# Patient Record
Sex: Female | Born: 1985 | Race: Asian | Hispanic: Yes | Marital: Married | Smoking: Never smoker
Health system: Southern US, Community
[De-identification: ages and names within clinical notes are randomized; demographics above are authoritative.]

## PROBLEM LIST (undated history)

## (undated) DIAGNOSIS — O24419 Gestational diabetes mellitus in pregnancy, unspecified control: Secondary | ICD-10-CM

## (undated) DIAGNOSIS — Z789 Other specified health status: Secondary | ICD-10-CM

---
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
## 2016-11-29 DIAGNOSIS — O321XX0 Maternal care for breech presentation, not applicable or unspecified: Secondary | ICD-10-CM

## 2018-07-16 ENCOUNTER — Other Ambulatory Visit: Payer: Self-pay

## 2018-07-16 ENCOUNTER — Emergency Department (HOSPITAL_COMMUNITY): Payer: Self-pay

## 2018-07-16 ENCOUNTER — Emergency Department (HOSPITAL_COMMUNITY)
Admission: EM | Admit: 2018-07-16 | Discharge: 2018-07-16 | Disposition: A | Discharge status: Home / Self Care | Payer: Self-pay | Attending: Emergency Medicine | Admitting: Emergency Medicine

## 2018-07-16 DIAGNOSIS — K625 Hemorrhage of anus and rectum: Secondary | ICD-10-CM | POA: Insufficient documentation

## 2018-07-16 DIAGNOSIS — Z79899 Other long term (current) drug therapy: Secondary | ICD-10-CM | POA: Insufficient documentation

## 2018-07-16 DIAGNOSIS — R1030 Lower abdominal pain, unspecified: Secondary | ICD-10-CM | POA: Insufficient documentation

## 2018-07-16 LAB — COMPREHENSIVE METABOLIC PANEL
ALK PHOS: 54 U/L (ref 38–126)
ALT: 13 U/L (ref 0–44)
AST: 17 U/L (ref 15–41)
Albumin: 3.8 g/dL (ref 3.5–5.0)
Anion gap: 8 (ref 5–15)
BILIRUBIN TOTAL: 0.6 mg/dL (ref 0.3–1.2)
BUN: 8 mg/dL (ref 6–20)
CO2: 25 mmol/L (ref 22–32)
Calcium: 9.1 mg/dL (ref 8.9–10.3)
Chloride: 105 mmol/L (ref 98–111)
Creatinine, Ser: 0.53 mg/dL (ref 0.44–1.00)
GFR calc Af Amer: >60 mL/min (ref >60)
GFR calc non Af Amer: >60 mL/min (ref >60)
Glucose, Bld: 106 mg/dL — ABNORMAL HIGH (ref 70–99)
Potassium: 4 mmol/L (ref 3.5–5.1)
Sodium: 138 mmol/L (ref 135–145)
TOTAL PROTEIN: 6.9 g/dL (ref 6.5–8.1)

## 2018-07-16 LAB — CBC WITH DIFFERENTIAL/PLATELET
Abs Immature Granulocytes: 0.03 10*3/uL (ref 0.00–0.07)
Basophils Absolute: 0 10*3/uL (ref 0.0–0.1)
Basophils Relative: 0 %
Eosinophils Absolute: 0.1 10*3/uL (ref 0.0–0.5)
Eosinophils Relative: 1 %
HCT: 40.5 % (ref 36.0–46.0)
HEMOGLOBIN: 12.7 g/dL (ref 12.0–15.0)
Immature Granulocytes: 0 %
Lymphocytes Relative: 23 %
Lymphs Abs: 1.5 10*3/uL (ref 0.7–4.0)
MCH: 27.7 pg (ref 26.0–34.0)
MCHC: 31.4 g/dL (ref 30.0–36.0)
MCV: 88.2 fL (ref 80.0–100.0)
MONO ABS: 0.4 10*3/uL (ref 0.1–1.0)
MONOS PCT: 6 %
Neutro Abs: 4.6 10*3/uL (ref 1.7–7.7)
Neutrophils Relative %: 70 %
Platelets: 241 10*3/uL (ref 150–400)
RBC: 4.59 MIL/uL (ref 3.87–5.11)
RDW: 12.5 % (ref 11.5–15.5)
WBC: 6.7 10*3/uL (ref 4.0–10.5)
nRBC: 0 % (ref 0.0–0.2)

## 2018-07-16 LAB — I-STAT BETA HCG BLOOD, ED (MC, WL, AP ONLY): I-stat hCG, quantitative: <5 m[IU]/mL (ref <5)

## 2018-07-16 MED ORDER — IOHEXOL 300 MG/ML  SOLN
100.0000 mL | Freq: Once | INTRAMUSCULAR | Status: AC | PRN
  Administered 2018-07-16: 100 mL via INTRAVENOUS

## 2018-07-16 MED ORDER — DOCUSATE SODIUM 100 MG PO CAPS: 100.0000 mg | ORAL_CAPSULE | Freq: Two times a day (BID) | ORAL | 2 refills | Status: AC

## 2018-07-16 MED ORDER — HYDROCORTISONE ACETATE 25 MG RE SUPP: 25.0000 mg | Freq: Two times a day (BID) | RECTAL | 1 refills | Status: AC

## 2018-07-16 NOTE — ED Provider Notes (Signed)
[facility] EMERGENCY DEPARTMENT Provider Note   CSN: [id number] Arrival date & time: 07/16/18  1660     History   Chief Complaint Chief Complaint  Patient presents with  . Rectal Bleeding    HPI Stephanie Wolfe is a 33 y.o. female.  The history is provided by the patient. No language interpreter was used.  Rectal Bleeding  Quality:  Bright red Amount:  Moderate Duration:  1 day Timing:  Constant Chronicity:  New Context: rectal injury   Context: not hemorrhoids   Similar prior episodes: no   Relieved by:  Nothing Worsened by:  Nothing Associated symptoms: abdominal pain   Pt reports bright red blood from rectum.    No past medical history on file.  There are no active problems to display for this patient.      OB History   No obstetric history on file.      Home Medications    Prior to Admission medications   Medication Sig Start Date End Date Taking? Authorizing Provider  famotidine (PEPCID) 20 MG tablet Take 20 mg by mouth 2 (two) times daily.   Yes [provider]    Family History No family history on file.  Social History Social History   Tobacco Use  . Smoking status: Not on file  Substance Use Topics  . Alcohol use: Not on file  . Drug use: Not on file     Allergies   Patient has no allergy information on record.   Review of Systems Review of Systems  Gastrointestinal: Positive for abdominal pain and hematochezia.  All other systems reviewed and are negative.    Physical Exam Updated Vital Signs BP 112/73   Pulse 74   Temp 98.2 F (36.8 C) (Oral)   Resp 16   Ht 5' 4.96" (1.65 m)   Wt 74.8 kg   SpO2 100%   BMI 27.49 kg/m   Physical Exam Vitals signs and nursing note reviewed.  Constitutional:      Appearance: She is well-developed.  HENT:     Head: Normocephalic.     Right Ear: Tympanic membrane normal.     Left Ear: Tympanic membrane normal.     Nose: Nose normal.     Mouth/Throat:   Mouth: Mucous membranes are moist.  Neck:     Musculoskeletal: Normal range of motion.  Cardiovascular:     Rate and Rhythm: Normal rate.     Pulses: Normal pulses.  Pulmonary:     Effort: Pulmonary effort is normal.  Abdominal:     General: There is no distension.     Tenderness: There is abdominal tenderness.     Comments: Tender lower abdomen   Genitourinary:    Rectum: Normal.  Musculoskeletal: Normal range of motion.  Skin:    General: Skin is warm.  Neurological:     Mental Status: She is alert and oriented to person, place, and time.  Psychiatric:        Mood and Affect: Mood normal.      ED Treatments / Results  Labs (all labs ordered are listed, but only abnormal results are displayed) Labs Reviewed  COMPREHENSIVE METABOLIC PANEL - Abnormal; Notable for the following components:      Result Value   Glucose, Bld 106 (*)    All other components within normal limits  CBC WITH DIFFERENTIAL/PLATELET  URINALYSIS, ROUTINE W REFLEX MICROSCOPIC  I-STAT BETA HCG BLOOD, ED ([facility], [facility], [facility] ONLY)    EKG None  Radiology Ct Abdomen Pelvis W Contrast  Result Date: 07/16/2018 CLINICAL DATA:  Abdominal pain and hematochezia for 2 days. EXAM: CT ABDOMEN AND PELVIS WITH CONTRAST TECHNIQUE: Multidetector CT imaging of the abdomen and pelvis was performed using the standard protocol following bolus administration of intravenous contrast. CONTRAST:  OMNIPAQUE IOHEXOL 300 MG/ML  SOLN COMPARISON:  None. FINDINGS: Lower Chest: No acute findings. Hepatobiliary: A few tiny sub-cm low-attenuation lesions are seen which are too small to characterize but most likely represent tiny cysts. No definite hepatic masses identified. Gallbladder is unremarkable. No evidence of biliary ductal dilatation. Pancreas:  No mass or inflammatory changes. Spleen: Within normal limits in size and appearance. Adrenals/Urinary Tract: No masses identified. No evidence of hydronephrosis. Unremarkable unopacified  urinary bladder. Stomach/Bowel: No evidence of obstruction, inflammatory process or abnormal fluid collections. Normal appendix visualized. Vascular/Lymphatic: No pathologically enlarged lymph nodes. No abdominal aortic aneurysm. Reproductive:  No mass or other significant abnormality. Other:  None. Musculoskeletal:  No suspicious bone lesions identified. IMPRESSION: No acute findings or other significant abnormality. Electronically Signed   By: Myles Rosenthal M.D.   On: 07/16/2018 12:13    Procedures Procedures (including critical care time)  Medications Ordered in ED Medications  iohexol (OMNIPAQUE) 300 MG/ML solution 100 mL (100 mLs Intravenous Contrast Given 07/16/18 1132)     Initial Impression / Assessment and Plan / ED Course  I have reviewed the triage vital signs and the nursing notes.  Pertinent labs & imaging results that were available during my care of the patient were reviewed by me and considered in my medical decision making (see chart for details).     MDM  No hemmorhoids noted.  Pt advised to follow up with her Gi doctor.  I suspect internal hemorrhoid.  Ct shows no diverticulitis   Final Clinical Impressions(s) / ED Diagnoses   Final diagnoses:  Rectal bleeding    ED Discharge Orders         Ordered    docusate sodium (COLACE) 100 MG capsule  2 times daily     07/16/18 1331    hydrocortisone (ANUSOL-HC) 25 MG suppository  Every 12 hours     07/16/18 1331           Osie Cheeks 07/16/18 1343    Gerhard Munch, MD 07/16/18 [phone]

## 2018-07-16 NOTE — ED Notes (Signed)
Pt given discharge instructions, prescription, and follow up information. Pt verbalized understanding. Pt given the opportunity to answer questions. Pt discharged from the ED.

## 2018-07-16 NOTE — ED Triage Notes (Addendum)
Pt reports that for the last two days she has had red blood in her stools and abdominal pain. Pt denies any nausea or vomiting.

## 2018-07-16 NOTE — Discharge Instructions (Signed)
See your Physician for recheck.  Return if any problems.  °

## 2019-06-26 ENCOUNTER — Other Ambulatory Visit: Payer: Self-pay

## 2019-06-26 ENCOUNTER — Ambulatory Visit (INDEPENDENT_AMBULATORY_CARE_PROVIDER_SITE_OTHER): Payer: Medicaid Other | Admitting: Medical

## 2019-06-26 ENCOUNTER — Other Ambulatory Visit (HOSPITAL_COMMUNITY)
Admission: RE | Admit: 2019-06-26 | Discharge: 2019-06-26 | Disposition: A | Discharge status: Home / Self Care | Payer: Medicaid Other | Source: Ambulatory Visit | Attending: Medical | Admitting: Medical

## 2019-06-26 ENCOUNTER — Encounter: Payer: Self-pay | Admitting: Medical

## 2019-06-26 VITALS — BP 114/77 | HR 91 | Wt 172.9 lb

## 2019-06-26 DIAGNOSIS — Z3480 Encounter for supervision of other normal pregnancy, unspecified trimester: Secondary | ICD-10-CM | POA: Diagnosis not present

## 2019-06-26 DIAGNOSIS — Z8759 Personal history of other complications of pregnancy, childbirth and the puerperium: Secondary | ICD-10-CM | POA: Insufficient documentation

## 2019-06-26 DIAGNOSIS — Z3A11 11 weeks gestation of pregnancy: Secondary | ICD-10-CM

## 2019-06-26 DIAGNOSIS — R8271 Bacteriuria: Secondary | ICD-10-CM

## 2019-06-26 DIAGNOSIS — O34219 Maternal care for unspecified type scar from previous cesarean delivery: Secondary | ICD-10-CM | POA: Insufficient documentation

## 2019-06-26 NOTE — Progress Notes (Signed)
   PRENATAL VISIT NOTE  Subjective:  Stephanie Wolfe is a 34 y.o. G5P4004 at [redacted]w[redacted]d being seen today for her first prenatal visit for this pregnancy.  She is currently monitored for the following issues for this high-risk pregnancy and has Supervision of other normal pregnancy, antepartum; Previous cesarean section complicating pregnancy, antepartum condition or complication; and History of amniotic fluid embolism on their problem list.  Patient reports no complaints.  Contractions: Not present. Vag. Bleeding: None.  Movement: Absent. Denies leaking of fluid.   She is planning to bottle feed. Desires condoms for contraception.   The following portions of the patient's history were reviewed and updated as appropriate: allergies, current medications, past family history, past medical history, past social history, past surgical history and problem list.   Objective:   Vitals:   06/26/19 1053  BP: 114/77  Pulse: 91  Weight: 172 lb 14.4 oz (78.4 kg)    Fetal Status: Fetal Heart Rate (bpm): [place]   Movement: Absent     General:  Alert, oriented and cooperative. Patient is in no acute distress.  Skin: Skin is warm and dry. No rash noted.   Cardiovascular: Normal heart rate and rhythm noted  Respiratory: Normal respiratory effort, no problems with respiration noted. Clear to auscultation.   Abdomen: Soft, gravid, appropriate for gestational age. Normal bowel sounds. Non-tender. Pain/Pressure: Absent     Pelvic: Cervical exam performed Dilation: Closed Effacement (%): Thick   Normal cervical contour, no lesions, no bleeding following pap, normal discharge Breast: declined   Extremities: Normal range of motion.  Edema: None  Mental Status: Normal mood and affect. Normal behavior. Normal judgment and thought content.   Assessment and Plan:  Pregnancy: G5P4004 at [redacted]w[redacted]d 1. Supervision of other normal pregnancy, antepartum - Cytology - PAP( [place]) - Obstetric Panel (and HIV) - Culture, OB  Urine - Panorama  2. Previous cesarean section complicating pregnancy, antepartum condition or complication - 2 previous C/S (see problem list for details)  - Planning repeat   3. History of amniotic fluid embolism - Patient to provide hospital information for ROI to confirm this diagnosis (see problem list for more details)   Preterm labor symptoms and general obstetric precautions including but not limited to vaginal bleeding, contractions, leaking of fluid and fetal movement were reviewed in detail with the patient. Please refer to After Visit Summary for other counseling recommendations.   No follow-ups on file.  Future Appointments  Date Time Provider Department Center  07/28/2019  1:00 PM Nugent, Odie Sera, NP [facility] None    Vonzella Nipple, PA-C

## 2019-06-26 NOTE — Progress Notes (Signed)
Pt is here for initial OB visit. LMP 04/06/19.

## 2019-06-26 NOTE — Patient Instructions (Signed)

## 2019-06-27 LAB — OBSTETRIC PANEL, INCLUDING HIV
Antibody Screen: NEGATIVE
Basophils Absolute: 0 10*3/uL (ref 0.0–0.2)
Basos: 0 %
EOS (ABSOLUTE): 0.1 10*3/uL (ref 0.0–0.4)
Eos: 1 %
HIV Screen 4th Generation wRfx: NONREACTIVE
Hematocrit: 38.1 % (ref 34.0–46.6)
Hemoglobin: 12.9 g/dL (ref 11.1–15.9)
Hepatitis B Surface Ag: NEGATIVE
Immature Grans (Abs): 0 10*3/uL (ref 0.0–0.1)
Immature Granulocytes: 0 %
Lymphocytes Absolute: 1.7 10*3/uL (ref 0.7–3.1)
Lymphs: 16 %
MCH: 29.1 pg (ref 26.6–33.0)
MCHC: 33.9 g/dL (ref 31.5–35.7)
MCV: 86 fL (ref 79–97)
Monocytes Absolute: 0.6 10*3/uL (ref 0.1–0.9)
Monocytes: 6 %
Neutrophils Absolute: 8.1 10*3/uL — ABNORMAL HIGH (ref 1.4–7.0)
Neutrophils: 77 %
Platelets: 248 10*3/uL (ref 150–450)
RBC: 4.44 x10E6/uL (ref 3.77–5.28)
RDW: 13 % (ref 11.7–15.4)
RPR Ser Ql: NONREACTIVE
Rh Factor: POSITIVE
Rubella Antibodies, IGG: 2.21 index (ref >0.99)
WBC: 10.5 10*3/uL (ref 3.4–10.8)

## 2019-06-29 LAB — URINE CULTURE, OB REFLEX

## 2019-06-29 LAB — CULTURE, OB URINE

## 2019-06-30 ENCOUNTER — Encounter: Payer: Self-pay | Admitting: Medical

## 2019-06-30 DIAGNOSIS — R8271 Bacteriuria: Secondary | ICD-10-CM | POA: Insufficient documentation

## 2019-06-30 MED ORDER — CEPHALEXIN 500 MG PO CAPS: 500.0000 mg | ORAL_CAPSULE | Freq: Four times a day (QID) | ORAL | 0 refills | Status: DC

## 2019-06-30 NOTE — Addendum Note (Signed)
Addended by: Marny Lowenstein on: 06/30/2019 09:01 AM   Modules accepted: Orders

## 2019-07-01 LAB — CYTOLOGY - PAP
Chlamydia: NEGATIVE
Comment: NEGATIVE
Comment: NEGATIVE
Comment: NORMAL
Diagnosis: NEGATIVE
High risk HPV: NEGATIVE
Neisseria Gonorrhea: NEGATIVE

## 2019-07-06 ENCOUNTER — Encounter: Payer: Self-pay | Admitting: Medical

## 2019-07-07 ENCOUNTER — Encounter: Payer: Self-pay | Admitting: Medical

## 2019-07-17 DIAGNOSIS — O9A212 Injury, poisoning and certain other consequences of external causes complicating pregnancy, second trimester: Secondary | ICD-10-CM | POA: Diagnosis present

## 2019-07-18 ENCOUNTER — Other Ambulatory Visit: Payer: Self-pay

## 2019-07-18 ENCOUNTER — Inpatient Hospital Stay (HOSPITAL_COMMUNITY)
Admission: AD | Admit: 2019-07-18 | Discharge: 2019-07-18 | Disposition: A | Discharge status: Home / Self Care | Payer: Medicaid Other | Attending: Family Medicine | Admitting: Family Medicine

## 2019-07-18 ENCOUNTER — Encounter (HOSPITAL_COMMUNITY): Payer: Self-pay | Admitting: Family Medicine

## 2019-07-18 DIAGNOSIS — S3991XA Unspecified injury of abdomen, initial encounter: Secondary | ICD-10-CM | POA: Diagnosis not present

## 2019-07-18 DIAGNOSIS — R109 Unspecified abdominal pain: Secondary | ICD-10-CM | POA: Insufficient documentation

## 2019-07-18 DIAGNOSIS — W501XXA Accidental kick by another person, initial encounter: Secondary | ICD-10-CM | POA: Diagnosis not present

## 2019-07-18 DIAGNOSIS — Z3A14 14 weeks gestation of pregnancy: Secondary | ICD-10-CM | POA: Diagnosis not present

## 2019-07-18 DIAGNOSIS — O9A212 Injury, poisoning and certain other consequences of external causes complicating pregnancy, second trimester: Secondary | ICD-10-CM

## 2019-07-18 DIAGNOSIS — O26892 Other specified pregnancy related conditions, second trimester: Secondary | ICD-10-CM | POA: Diagnosis not present

## 2019-07-18 DIAGNOSIS — Z3480 Encounter for supervision of other normal pregnancy, unspecified trimester: Secondary | ICD-10-CM

## 2019-07-18 LAB — URINALYSIS, ROUTINE W REFLEX MICROSCOPIC
Bilirubin Urine: NEGATIVE
Glucose, UA: 150 mg/dL — AB
Hgb urine dipstick: NEGATIVE
Ketones, ur: NEGATIVE mg/dL
Leukocytes,Ua: NEGATIVE
Nitrite: NEGATIVE
Protein, ur: 30 mg/dL — AB
Specific Gravity, Urine: 1.021 (ref 1.005–1.030)
pH: 7 (ref 5.0–8.0)

## 2019-07-18 MED ORDER — PREPLUS 27-1 MG PO TABS: 1.0000 | ORAL_TABLET | Freq: Once | ORAL | 3 refills | Status: AC

## 2019-07-18 NOTE — Discharge Instructions (Signed)
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
## 2019-07-18 NOTE — MAU Note (Signed)
Provider at bedside with U/S. FHB visible and +fetal movement observed. Pt reassured with exam.

## 2019-07-20 ENCOUNTER — Inpatient Hospital Stay (HOSPITAL_BASED_OUTPATIENT_CLINIC_OR_DEPARTMENT_OTHER): Payer: Medicaid Other

## 2019-07-20 ENCOUNTER — Inpatient Hospital Stay (HOSPITAL_COMMUNITY)
Admission: AD | Admit: 2019-07-20 | Discharge: 2019-07-20 | Disposition: A | Discharge status: Home / Self Care | Payer: Medicaid Other | Attending: Obstetrics & Gynecology | Admitting: Obstetrics & Gynecology

## 2019-07-20 ENCOUNTER — Other Ambulatory Visit: Payer: Self-pay

## 2019-07-20 ENCOUNTER — Encounter (HOSPITAL_COMMUNITY): Payer: Self-pay | Admitting: Obstetrics & Gynecology

## 2019-07-20 DIAGNOSIS — O209 Hemorrhage in early pregnancy, unspecified: Secondary | ICD-10-CM | POA: Diagnosis present

## 2019-07-20 DIAGNOSIS — O208 Other hemorrhage in early pregnancy: Secondary | ICD-10-CM | POA: Diagnosis not present

## 2019-07-20 DIAGNOSIS — O34219 Maternal care for unspecified type scar from previous cesarean delivery: Secondary | ICD-10-CM | POA: Diagnosis not present

## 2019-07-20 DIAGNOSIS — Z3A15 15 weeks gestation of pregnancy: Secondary | ICD-10-CM | POA: Diagnosis not present

## 2019-07-20 DIAGNOSIS — O9A212 Injury, poisoning and certain other consequences of external causes complicating pregnancy, second trimester: Secondary | ICD-10-CM | POA: Diagnosis present

## 2019-07-20 DIAGNOSIS — O4692 Antepartum hemorrhage, unspecified, second trimester: Secondary | ICD-10-CM

## 2019-07-20 DIAGNOSIS — O468X2 Other antepartum hemorrhage, second trimester: Secondary | ICD-10-CM | POA: Diagnosis present

## 2019-07-20 DIAGNOSIS — O418X20 Other specified disorders of amniotic fluid and membranes, second trimester, not applicable or unspecified: Secondary | ICD-10-CM | POA: Diagnosis present

## 2019-07-20 LAB — URINALYSIS, ROUTINE W REFLEX MICROSCOPIC
Bilirubin Urine: NEGATIVE
Glucose, UA: >=500 mg/dL — AB
Ketones, ur: NEGATIVE mg/dL
Leukocytes,Ua: NEGATIVE
Nitrite: NEGATIVE
Protein, ur: NEGATIVE mg/dL
Specific Gravity, Urine: 1.021 (ref 1.005–1.030)
pH: 7 (ref 5.0–8.0)

## 2019-07-20 LAB — WET PREP, GENITAL
Clue Cells Wet Prep HPF POC: NONE SEEN
Sperm: NONE SEEN
Trich, Wet Prep: NONE SEEN
Yeast Wet Prep HPF POC: NONE SEEN

## 2019-07-20 LAB — CBC
HCT: 35.7 % — ABNORMAL LOW (ref 36.0–46.0)
Hemoglobin: 12 g/dL (ref 12.0–15.0)
MCH: 29 pg (ref 26.0–34.0)
MCHC: 33.6 g/dL (ref 30.0–36.0)
MCV: 86.2 fL (ref 80.0–100.0)
Platelets: 222 10*3/uL (ref 150–400)
RBC: 4.14 MIL/uL (ref 3.87–5.11)
RDW: 13.2 % (ref 11.5–15.5)
WBC: 10.5 10*3/uL (ref 4.0–10.5)
nRBC: 0 % (ref 0.0–0.2)

## 2019-07-20 NOTE — Discharge Instructions (Signed)
Return to MAU: °· If you have heavier bleeding that soaks through more that 2 pads per hour for an hour or more °· If you bleed so much that you feel like you might pass out or you do pass out °· If you have significant abdominal pain that is not improved with Tylenol  °· If you develop a fever > 100.5 °  °

## 2019-07-20 NOTE — MAU Note (Addendum)
Pt reports to Stephanie Wolfe c/o vaginal bleeding that she noticed when she wiped in the bathroom pt reports it was red with some white "secretions." pt denies any pain. Pt denies any intercourse. Pt reports occasionally when she lays down she feels uncomfortable but denies any of those symptoms now.
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
## 2019-07-21 LAB — GC/CHLAMYDIA PROBE AMP (~~LOC~~) NOT AT ARMC
Chlamydia: NEGATIVE
Comment: NEGATIVE
Comment: NORMAL
Neisseria Gonorrhea: NEGATIVE

## 2019-07-22 LAB — CULTURE, OB URINE: Culture: 30000 — AB

## 2019-07-23 ENCOUNTER — Telehealth: Payer: Self-pay | Admitting: Certified Nurse Midwife

## 2019-07-23 DIAGNOSIS — B951 Streptococcus, group B, as the cause of diseases classified elsewhere: Secondary | ICD-10-CM

## 2019-07-23 MED ORDER — CEFADROXIL 500 MG PO CAPS: 500.0000 mg | ORAL_CAPSULE | Freq: Two times a day (BID) | ORAL | 0 refills | Status: DC

## 2019-07-23 NOTE — Telephone Encounter (Signed)
Called pt using [employer] interpreter [phone]. Pt confirms she finished abx about 2 weeks ago prescribed for asymptomatic GBS UTI. Denies urinary sx. Will treat again for GBS UTI present on UC 07/20/19.

## 2019-07-28 ENCOUNTER — Other Ambulatory Visit: Payer: Self-pay

## 2019-07-28 ENCOUNTER — Telehealth (INDEPENDENT_AMBULATORY_CARE_PROVIDER_SITE_OTHER): Payer: Medicaid Other | Admitting: Women's Health

## 2019-07-28 DIAGNOSIS — O418X20 Other specified disorders of amniotic fluid and membranes, second trimester, not applicable or unspecified: Secondary | ICD-10-CM | POA: Diagnosis not present

## 2019-07-28 DIAGNOSIS — O34219 Maternal care for unspecified type scar from previous cesarean delivery: Secondary | ICD-10-CM

## 2019-07-28 DIAGNOSIS — Z3480 Encounter for supervision of other normal pregnancy, unspecified trimester: Secondary | ICD-10-CM

## 2019-07-28 DIAGNOSIS — O468X2 Other antepartum hemorrhage, second trimester: Secondary | ICD-10-CM

## 2019-07-28 DIAGNOSIS — Z3A16 16 weeks gestation of pregnancy: Secondary | ICD-10-CM

## 2019-07-28 DIAGNOSIS — R8271 Bacteriuria: Secondary | ICD-10-CM

## 2019-07-28 DIAGNOSIS — O9A212 Injury, poisoning and certain other consequences of external causes complicating pregnancy, second trimester: Secondary | ICD-10-CM

## 2019-07-28 MED ORDER — BLOOD PRESSURE KIT DEVI: 1.0000 | 0 refills | Status: DC | PRN

## 2019-07-28 NOTE — Progress Notes (Signed)
Virtual Visit via Telephone Note  I connected with Stephanie Wolfe on 07/28/19 at  1:00 PM EST by telephone and verified that I am speaking with the correct person using two identifiers. Used interpreter  [phone]  Pt does not have a BP cuff. Pt c/o VB last week. No VB 2 days after hospital visit

## 2019-07-28 NOTE — Patient Instructions (Addendum)
Second Trimester of Pregnancy The second trimester is from week 14 through week 27 (months 4 through 6). The second trimester is often a time when you feel your best. Your body has adjusted to being pregnant, and you begin to feel better physically. Usually, morning sickness has lessened or quit completely, you may have more energy, and you may have an increase in appetite. The second trimester is also a time when the fetus is growing rapidly. At the end of the sixth month, the fetus is about 9 inches long and weighs about 1 pounds. You will likely begin to feel the baby move (quickening) between 16 and 20 weeks of pregnancy. Body changes during your second trimester Your body continues to go through many changes during your second trimester. The changes vary from woman to woman.  Your weight will continue to increase. You will notice your lower abdomen bulging out.  You may begin to get stretch marks on your hips, abdomen, and breasts.  You may develop headaches that can be relieved by medicines. The medicines should be approved by your health care provider.  You may urinate more often because the fetus is pressing on your bladder.  You may develop or continue to have heartburn as a result of your pregnancy.  You may develop constipation because certain hormones are causing the muscles that push waste through your intestines to slow down.  You may develop hemorrhoids or swollen, bulging veins (varicose veins).  You may have back pain. This is caused by: ? Weight gain. ? Pregnancy hormones that are relaxing the joints in your pelvis. ? A shift in weight and the muscles that support your balance.  Your breasts will continue to grow and they will continue to become tender.  Your gums may bleed and may be sensitive to brushing and flossing.  Dark spots or blotches (chloasma, mask of pregnancy) may develop on your face. This will likely fade after the baby is born.  A dark line from your  belly button to the pubic area (linea nigra) may appear. This will likely fade after the baby is born.  You may have changes in your hair. These can include thickening of your hair, rapid growth, and changes in texture. Some women also have hair loss during or after pregnancy, or hair that feels dry or thin. Your hair will most likely return to normal after your baby is born. What to expect at prenatal visits During a routine prenatal visit:  You will be weighed to make sure you and the fetus are growing normally.  Your blood pressure will be taken.  Your abdomen will be measured to track your baby's growth.  The fetal heartbeat will be listened to.  Any test results from the previous visit will be discussed. Your health care provider may ask you:  How you are feeling.  If you are feeling the baby move.  If you have had any abnormal symptoms, such as leaking fluid, bleeding, severe headaches, or abdominal cramping.  If you are using any tobacco products, including cigarettes, chewing tobacco, and electronic cigarettes.  If you have any questions. Other tests that may be performed during your second trimester include:  Blood tests that check for: ? Low iron levels (anemia). ? High blood sugar that affects pregnant women (gestational diabetes) between 24 and 28 weeks. ? Rh antibodies. This is to check for a protein on red blood cells (Rh factor).  Urine tests to check for infections, diabetes, or protein in the   urine.  An ultrasound to confirm the proper growth and development of the baby.  An amniocentesis to check for possible genetic problems.  Fetal screens for spina bifida and Down syndrome.  HIV (human immunodeficiency virus) testing. Routine prenatal testing includes screening for HIV, unless you choose not to have this test. Follow these instructions at home: Medicines  Follow your health care provider's instructions regarding medicine use. Specific medicines may be  either safe or unsafe to take during pregnancy.  Take a prenatal vitamin that contains at least 600 micrograms (mcg) of folic acid.  If you develop constipation, try taking a stool softener if your health care provider approves. Eating and drinking   Eat a balanced diet that includes fresh fruits and vegetables, whole grains, good sources of protein such as meat, eggs, or tofu, and low-fat dairy. Your health care provider will help you determine the amount of weight gain that is right for you.  Avoid raw meat and uncooked cheese. These carry germs that can cause birth defects in the baby.  If you have low calcium intake from food, talk to your health care provider about whether you should take a daily calcium supplement.  Limit foods that are high in fat and processed sugars, such as fried and sweet foods.  To prevent constipation: ? Drink enough fluid to keep your urine clear or pale yellow. ? Eat foods that are high in fiber, such as fresh fruits and vegetables, whole grains, and beans. Activity  Exercise only as directed by your health care provider. Most women can continue their usual exercise routine during pregnancy. Try to exercise for 30 minutes at least 5 days a week. Stop exercising if you experience uterine contractions.  Avoid heavy lifting, wear low heel shoes, and practice good posture.  A sexual relationship may be continued unless your health care provider directs you otherwise. Relieving pain and discomfort  Wear a good support bra to prevent discomfort from breast tenderness.  Take warm sitz baths to soothe any pain or discomfort caused by hemorrhoids. Use hemorrhoid cream if your health care provider approves.  Rest with your legs elevated if you have leg cramps or low back pain.  If you develop varicose veins, wear support hose. Elevate your feet for 15 minutes, 3-4 times a day. Limit salt in your diet. Prenatal Care  Write down your questions. Take them to  your prenatal visits.  Keep all your prenatal visits as told by your health care provider. This is important. Safety  Wear your seat belt at all times when driving.  Make a list of emergency phone numbers, including numbers for family, friends, the hospital, and police and fire departments. General instructions  Ask your health care provider for a referral to a local prenatal education class. Begin classes no later than the beginning of month 6 of your pregnancy.  Ask for help if you have counseling or nutritional needs during pregnancy. Your health care provider can offer advice or refer you to specialists for help with various needs.  Do not use hot tubs, steam rooms, or saunas.  Do not douche or use tampons or scented sanitary pads.  Do not cross your legs for long periods of time.  Avoid cat litter boxes and soil used by cats. These carry germs that can cause birth defects in the baby and possibly loss of the fetus by miscarriage or stillbirth.  Avoid all smoking, herbs, alcohol, and unprescribed drugs. Chemicals in these products can affect the formation   and growth of the baby.  Do not use any products that contain nicotine or tobacco, such as cigarettes and e-cigarettes. If you need help quitting, ask your health care provider.  Visit your dentist if you have not gone yet during your pregnancy. Use a soft toothbrush to brush your teeth and be gentle when you floss. Contact a health care provider if:  You have dizziness.  You have mild pelvic cramps, pelvic pressure, or nagging pain in the abdominal area.  You have persistent nausea, vomiting, or diarrhea.  You have a bad smelling vaginal discharge.  You have pain when you urinate. Get help right away if:  You have a fever.  You are leaking fluid from your vagina.  You have spotting or bleeding from your vagina.  You have severe abdominal cramping or pain.  You have rapid weight gain or weight loss.  You have  shortness of breath with chest pain.  You notice sudden or extreme swelling of your face, hands, ankles, feet, or legs.  You have not felt your baby move in over an hour.  You have severe headaches that do not go away when you take medicine.  You have vision changes. Summary  The second trimester is from week 14 through week 27 (months 4 through 6). It is also a time when the fetus is growing rapidly.  Your body goes through many changes during pregnancy. The changes vary from woman to woman.  Avoid all smoking, herbs, alcohol, and unprescribed drugs. These chemicals affect the formation and growth your baby.  Do not use any tobacco products, such as cigarettes, chewing tobacco, and e-cigarettes. If you need help quitting, ask your health care provider.  Contact your health care provider if you have any questions. Keep all prenatal visits as told by your health care provider. This is important. This information is not intended to replace advice given to you by your health care provider. Make sure you discuss any questions you have with your health care provider. Document Revised: 09/26/2018 Document Reviewed: 07/10/2016 Elsevier Patient Education  2020 [employer].   Maternity Assessment Unit (MAU)  The Maternity Assessment Unit (MAU) is located at the [facility]. The address is: [address], [place], [place], [place] [address]. Please see map below for additional directions.    The Maternity Assessment Unit is designed to help you during your pregnancy, and for up to 6 weeks after delivery, with any pregnancy- or postpartum-related emergencies, if you think you are in labor, or if your water has broken. For example, if you experience nausea and vomiting, vaginal bleeding, severe abdominal or pelvic pain, elevated blood pressure or other problems related to your pregnancy or postpartum time, please come to the Maternity Assessment  Unit for assistance.  AREA PEDIATRIC/FAMILY PRACTICE PHYSICIANS  [facility] [address] [place], [place] [address] Phone - [phone]   Fax - [phone]  [facility] [address] [place], [place]  [address] Phone - [phone]   Fax - [phone]  [facility] [address] [place], [place]  [address] Phone - [phone]   Fax - [phone]  [facility] [address] [place], [place]  [address] Phone - [phone]   Fax - [phone]  [facility] [address] [place], [place]  [address] Phone - [phone]   Fax - [phone]  [facility] [address] [place]  [address] Phone - [phone]   Fax - [phone]  [facility] [place] [address] [place], [place]  [address] Phone - [phone]   Fax - [phone]  [facility] AT [facility] [address] [place], [address] [place], [place]  [address] Phone - [phone]   Fax - [phone]  [facility] AT [facility] [address] [place], [place]  [address] Phone - [phone]   Fax - [phone] [facility] [place] [address] [place], [place]  [address] Phone - [phone]   Fax - [phone]  [facility] [address] [place], [place]  [address] Phone - [phone]   Fax - [phone]  [facility] [address] [place], [place]  [address] Phone - [phone]   Fax - [phone]  [facility] [place] [address] [place], [place]  [address] Phone - [phone]   Fax - [phone]  [facility] [address] [place], [place]  [address] Phone - [phone]  [facility] [address] [place], [place]  [address] Phone - [phone]   Fax - [phone]  [facility] [address] [place], [place]  [address] Phone - [phone]   Fax - [phone]  [facility] [address] [place], [place]  [address] Phone - [phone]   Fax - [phone]  [facility] [address] [place], [place]  [address] Phone - [phone]   Fax - [phone]   [facility] [address] [place], [place]  [address] Phone - [phone]   Fax - [phone]  [facility] [address] [place], [place]  [address] Phone - [phone]   Fax - [phone]  DA[facility]ID RUBIN [address] [place], [place]  [address] Phone - [phone]   Fax - [phone]  [facility] [address] [place], [place]  [address] Phone - [phone]   Fax - [phone]  [facility] [address] [place], [place]  [address] Phone - [phone]   Fax - [phone] Gerarda Fraction [address] [place], [place]  [address] Phone - [phone]   Fax - [phone]  [facility] [address] [place], [place]  [address] Phone - [phone]   Fax - [phone]  [facility] - [place] [address] [place], [place]  [address] Phone - [phone]   Fax - [phone]    Preterm Labor and Birth Information  The normal length of a pregnancy is 39-41 weeks. Preterm labor is when labor starts before 37 completed weeks of pregnancy. What are the risk factors for preterm labor? Preterm labor is more likely to occur in women who:  Have certain infections during pregnancy such as a bladder infection, sexually transmitted infection, or infection inside the uterus (chorioamnionitis).  Have a shorter-than-normal cervix.  Have gone into preterm labor before.  Have had surgery on their cervix.  Are younger than age 61 or older than age 68.  Are African  American.  Are pregnant with twins or multiple babies (multiple gestation).  Take street drugs or smoke while pregnant.  Do not gain enough weight while pregnant.  Became pregnant shortly after having been pregnant. What are the symptoms of preterm labor? Symptoms of preterm labor include:  Cramps similar to  those that can happen during a menstrual period. The cramps may happen with diarrhea.  Pain in the abdomen or lower back.  Regular uterine contractions that may feel like tightening of the abdomen.  A feeling of increased pressure in the pelvis.  Increased watery or bloody mucus discharge from the vagina.  Water breaking (ruptured amniotic sac). Why is it important to recognize signs of preterm labor? It is important to recognize signs of preterm labor because babies who are born prematurely may not be fully developed. This can put them at an increased risk for:  Long-term (chronic) heart and lung problems.  Difficulty immediately after birth with regulating body systems, including blood sugar, body temperature, heart rate, and breathing rate.  Bleeding in the brain.  Cerebral palsy.  Learning difficulties.  Death. These risks are highest for babies who are born before 63 weeks of pregnancy. How is preterm labor treated? Treatment depends on the length of your pregnancy, your condition, and the health of your baby. It may involve:  Having a stitch (suture) placed in your cervix to prevent your cervix from opening too early (cerclage).  Taking or being given medicines, such as: ? Hormone medicines. These may be given early in pregnancy to help support the pregnancy. ? Medicine to stop contractions. ? Medicines to help mature the baby's lungs. These may be prescribed if the risk of delivery is high. ? Medicines to prevent your baby from developing cerebral palsy. If the labor happens before 34 weeks of pregnancy, you may need to stay in the hospital. What should I  do if I think I am in preterm labor? If you think that you are going into preterm labor, call your health care provider right away. How can I prevent preterm labor in future pregnancies? To increase your chance of having a full-term pregnancy:  Do not use any tobacco products, such as cigarettes, chewing tobacco, and e-cigarettes. If you need help quitting, ask your health care provider.  Do not use street drugs or medicines that have not been prescribed to you during your pregnancy.  Talk with your health care provider before taking any herbal supplements, even if you have been taking them regularly.  Make sure you gain a healthy amount of weight during your pregnancy.  Watch for infection. If you think that you might have an infection, get it checked right away.  Make sure to tell your health care provider if you have gone into preterm labor before. This information is not intended to replace advice given to you by your health care provider. Make sure you discuss any questions you have with your health care provider. Document Revised: 09/26/2018 Document Reviewed: 10/26/2015 Elsevier Patient Education  [employer].

## 2019-07-28 NOTE — Progress Notes (Signed)
I connected with Stephanie Wolfe on 07/28/19 at  1:00 PM EST by: MyChart Video and verified that I am speaking with the correct person using two identifiers.  Patient is located at home and provider is located at [facility].     The purpose of this virtual visit is to provide medical care while limiting exposure to the novel coronavirus. I discussed the limitations, risks, security and privacy concerns of performing an evaluation and management service by MyChart and the availability of in person appointments. I also discussed with the patient that there may be a patient responsible charge related to this service. By engaging in this virtual visit, you consent to the provision of healthcare.  Additionally, you authorize for your insurance to be billed for the services provided during this visit.  The patient expressed understanding and agreed to proceed.  The following staff members participated in the virtual visit:  Donia Ast, NP    PRENATAL VISIT NOTE  Subjective:  Stephanie Wolfe is a 34 y.o. [id number] at [redacted]w[redacted]d  for phone visit for ongoing prenatal care.  She is currently monitored for the following issues for this low-risk pregnancy and has Supervision of other normal pregnancy, antepartum; Previous cesarean section complicating pregnancy, antepartum condition or complication; History of amniotic fluid embolism; Group B streptococcal bacteriuria; Traumatic injury during pregnancy in second trimester; and Subchorionic hematoma in second trimester on their problem list.  Patient reports no complaints.  Contractions: Not present. Vag. Bleeding: None.  Movement: Present. Denies leaking of fluid.   The following portions of the patient's history were reviewed and updated as appropriate: allergies, current medications, past family history, past medical history, past social history, past surgical history and problem list.   Objective:  There were no vitals filed for this visit. Self-Obtained  Fetal  Status:     Movement: Present     Assessment and Plan:  Pregnancy: G5P4004 at [redacted]w[redacted]d  1. Supervision of other normal pregnancy, antepartum -anatomy scan ordered -peds list given -BP cuff ordered by RN, pt did not have at home today to check BP -AFI next visit  2. Subchorionic hematoma in second trimester, single or unspecified fetus -pt denies any bleeding at this time  3. Group B streptococcal bacteriuria -GBS+ in urine at NOB, will treat in labor -pt given ABX on 07/22/2018 to retreat infection -TOC next visit  4. Previous cesarean section complicating pregnancy, antepartum condition or complication -previous C/S x2, needs repeat  5. History of amniotic fluid embolism -patient to provide hospital information for ROI to confirm this diagnosis (see problem list for more details), patient reports she has information, but she has not signed an ROI. Will bring information to next visit in-person.  Mandarin interpretation services used for entire visit.  Preterm labor symptoms and general obstetric precautions including but not limited to vaginal bleeding, contractions, leaking of fluid and fetal movement were reviewed in detail with the patient. I discussed the assessment and treatment plan with the patient. The patient was provided an opportunity to ask questions and all were answered. The patient agreed with the plan and demonstrated an understanding of the instructions. The patient was advised to call back or seek an in-person office evaluation/go to MAU at [facility] for any urgent or concerning symptoms.  Return in about 4 weeks (around 08/25/2019) for in-person ROB, needs anatomy scan scheduled, pt speaks Mandarin.  No future appointments.   Time spent on virtual visit: 15 minutes  Marylen Ponto, NP

## 2019-08-18 ENCOUNTER — Other Ambulatory Visit (HOSPITAL_COMMUNITY): Payer: Self-pay | Admitting: *Deleted

## 2019-08-18 ENCOUNTER — Ambulatory Visit (HOSPITAL_COMMUNITY): Payer: Medicaid Other | Admitting: *Deleted

## 2019-08-18 ENCOUNTER — Encounter (HOSPITAL_COMMUNITY): Payer: Self-pay | Admitting: *Deleted

## 2019-08-18 ENCOUNTER — Ambulatory Visit (HOSPITAL_COMMUNITY)
Admission: RE | Admit: 2019-08-18 | Discharge: 2019-08-18 | Disposition: A | Discharge status: Home / Self Care | Payer: Medicaid Other | Source: Ambulatory Visit | Attending: Women's Health | Admitting: Women's Health

## 2019-08-18 ENCOUNTER — Other Ambulatory Visit: Payer: Self-pay

## 2019-08-18 DIAGNOSIS — Z3480 Encounter for supervision of other normal pregnancy, unspecified trimester: Secondary | ICD-10-CM | POA: Insufficient documentation

## 2019-08-18 DIAGNOSIS — O9A212 Injury, poisoning and certain other consequences of external causes complicating pregnancy, second trimester: Secondary | ICD-10-CM

## 2019-08-18 DIAGNOSIS — Z3A19 19 weeks gestation of pregnancy: Secondary | ICD-10-CM

## 2019-08-18 DIAGNOSIS — O34219 Maternal care for unspecified type scar from previous cesarean delivery: Secondary | ICD-10-CM

## 2019-08-18 DIAGNOSIS — Z362 Encounter for other antenatal screening follow-up: Secondary | ICD-10-CM

## 2019-08-25 ENCOUNTER — Ambulatory Visit (INDEPENDENT_AMBULATORY_CARE_PROVIDER_SITE_OTHER): Payer: Medicaid Other | Admitting: Family Medicine

## 2019-08-25 ENCOUNTER — Other Ambulatory Visit: Payer: Self-pay

## 2019-08-25 VITALS — BP 116/79 | HR 120 | Temp 98.5°F | Wt 181.0 lb

## 2019-08-25 DIAGNOSIS — O418X20 Other specified disorders of amniotic fluid and membranes, second trimester, not applicable or unspecified: Secondary | ICD-10-CM

## 2019-08-25 DIAGNOSIS — O34211 Maternal care for low transverse scar from previous cesarean delivery: Secondary | ICD-10-CM

## 2019-08-25 DIAGNOSIS — Z3480 Encounter for supervision of other normal pregnancy, unspecified trimester: Secondary | ICD-10-CM

## 2019-08-25 DIAGNOSIS — Z758 Other problems related to medical facilities and other health care: Secondary | ICD-10-CM

## 2019-08-25 DIAGNOSIS — R8271 Bacteriuria: Secondary | ICD-10-CM

## 2019-08-25 DIAGNOSIS — O34219 Maternal care for unspecified type scar from previous cesarean delivery: Secondary | ICD-10-CM

## 2019-08-25 DIAGNOSIS — Z789 Other specified health status: Secondary | ICD-10-CM

## 2019-08-25 DIAGNOSIS — Z3A20 20 weeks gestation of pregnancy: Secondary | ICD-10-CM

## 2019-08-25 DIAGNOSIS — Z8759 Personal history of other complications of pregnancy, childbirth and the puerperium: Secondary | ICD-10-CM

## 2019-08-25 DIAGNOSIS — O468X2 Other antepartum hemorrhage, second trimester: Secondary | ICD-10-CM

## 2019-08-25 NOTE — Progress Notes (Signed)
Subjective:  Stephanie Wolfe is a 34 y.o. G5P4004 at [redacted]w[redacted]d being seen today for ongoing prenatal care.  She is currently monitored for the following issues for this low-risk pregnancy and has Supervision of other normal pregnancy, antepartum; Previous cesarean section complicating pregnancy, antepartum condition or complication; History of amniotic fluid embolism; Group B streptococcal bacteriuria; Traumatic injury during pregnancy in second trimester; Subchorionic hematoma in second trimester; and Language barrier on their problem list.  Patient reports no complaints.  Contractions: Irritability. Vag. Bleeding: None.  Movement: Present. Denies leaking of fluid.   The following portions of the patient's history were reviewed and updated as appropriate: allergies, current medications, past family history, past medical history, past social history, past surgical history and problem list. Problem list updated.  Objective:   Vitals:   08/25/19 1307  BP: 116/79  Pulse: (!) 120  Temp: 98.5 F (36.9 C)  Weight: 181 lb (82.1 kg)    Fetal Status: Fetal Heart Rate (bpm): 160 Fundal Height: 21 cm Movement: Present     General:  Alert, oriented and cooperative. Patient is in no acute distress.  Skin: Skin is warm and dry. No rash noted.   Cardiovascular: Normal heart rate noted  Respiratory: Normal respiratory effort, no problems with respiration noted  Abdomen: Soft, gravid, appropriate for gestational age. Pain/Pressure: Present     Pelvic: Vag. Bleeding: None     Cervical exam deferred        Extremities: Normal range of motion.  Edema: None  Mental Status: Normal mood and affect. Normal behavior. Normal judgment and thought content.   Urinalysis:      Assessment and Plan:  Pregnancy: G5P4004 at [redacted]w[redacted]d  1. Supervision of other normal pregnancy, antepartum - AFP collected today - Continue routine prenatal care - Considering Nexplanon vs Depo for birth control  2. Previous cesarean section  complicating pregnancy, antepartum condition or complication - CS x2, will need repeat  3. Group B streptococcal bacteriuria - Tx with ABX 07/23/19 - TOC collected today  4. Subchorionic hematoma in second trimester, single or unspecified fetus - MFM appt 09/15/19  5. History of amniotic fluid embolism - Still need records for previous delivery, records release signed today  6. Language barrier - Mandarin interpreting services used for this encounter  Preterm labor symptoms and general obstetric precautions including but not limited to vaginal bleeding, contractions, leaking of fluid and fetal movement were reviewed in detail with the patient. Please refer to After Visit Summary for other counseling recommendations.  Return in about 4 weeks (around 09/22/2019) for ROB.   Stephanie Wolfe L, DO

## 2019-08-25 NOTE — Patient Instructions (Addendum)

## 2019-08-25 NOTE — Progress Notes (Signed)
Mandarin Interpreter  Stephanie Wolfe #[id number].  C/o pain in vagina and back if she sit for long periods.

## 2019-08-27 LAB — URINE CULTURE

## 2019-08-28 LAB — AFP, SERUM, OPEN SPINA BIFIDA
AFP MoM: 1.15
AFP Value: 59 ng/mL
Gest. Age on Collection Date: 20.1 weeks
Maternal Age At EDD: 33.8 yr
OSBR Risk 1 IN: 7603
Test Results:: NEGATIVE
Weight: 181 [lb_av]

## 2019-08-31 ENCOUNTER — Other Ambulatory Visit: Payer: Self-pay | Admitting: Family Medicine

## 2019-08-31 ENCOUNTER — Other Ambulatory Visit: Payer: Self-pay | Admitting: *Deleted

## 2019-08-31 DIAGNOSIS — R8271 Bacteriuria: Secondary | ICD-10-CM

## 2019-08-31 MED ORDER — AMOXICILLIN 500 MG PO CAPS: 500.0000 mg | ORAL_CAPSULE | Freq: Three times a day (TID) | ORAL | 0 refills | Status: DC

## 2019-08-31 MED ORDER — AMOXICILLIN 500 MG PO CAPS: 500.0000 mg | ORAL_CAPSULE | Freq: Three times a day (TID) | ORAL | 0 refills | Status: AC

## 2019-08-31 NOTE — Progress Notes (Signed)
Script sent for amoxicillin 2/2 GBS bacteruria  Vienna Folden, Margarette Asal, DO OB Fellow, Faculty [facility] 08/31/2019 8:53 AM

## 2019-08-31 NOTE — Progress Notes (Signed)
Amoxicillin order was resent- did not go to pharmacy when initially ordered. Pt aware.

## 2019-09-15 ENCOUNTER — Other Ambulatory Visit: Payer: Self-pay

## 2019-09-15 ENCOUNTER — Encounter (HOSPITAL_COMMUNITY): Payer: Self-pay | Admitting: *Deleted

## 2019-09-15 ENCOUNTER — Ambulatory Visit (HOSPITAL_COMMUNITY)
Admission: RE | Admit: 2019-09-15 | Discharge: 2019-09-15 | Disposition: A | Discharge status: Home / Self Care | Payer: Medicaid Other | Source: Ambulatory Visit | Attending: Obstetrics and Gynecology | Admitting: Obstetrics and Gynecology

## 2019-09-15 ENCOUNTER — Ambulatory Visit (HOSPITAL_COMMUNITY): Payer: Medicaid Other | Admitting: *Deleted

## 2019-09-15 DIAGNOSIS — O9A212 Injury, poisoning and certain other consequences of external causes complicating pregnancy, second trimester: Secondary | ICD-10-CM | POA: Insufficient documentation

## 2019-09-15 DIAGNOSIS — Z3480 Encounter for supervision of other normal pregnancy, unspecified trimester: Secondary | ICD-10-CM | POA: Diagnosis present

## 2019-09-15 DIAGNOSIS — O4692 Antepartum hemorrhage, unspecified, second trimester: Secondary | ICD-10-CM

## 2019-09-15 DIAGNOSIS — O34219 Maternal care for unspecified type scar from previous cesarean delivery: Secondary | ICD-10-CM

## 2019-09-15 DIAGNOSIS — Z362 Encounter for other antenatal screening follow-up: Secondary | ICD-10-CM | POA: Insufficient documentation

## 2019-09-15 DIAGNOSIS — Z3A23 23 weeks gestation of pregnancy: Secondary | ICD-10-CM | POA: Diagnosis not present

## 2019-09-22 ENCOUNTER — Ambulatory Visit (INDEPENDENT_AMBULATORY_CARE_PROVIDER_SITE_OTHER): Payer: Medicaid Other | Admitting: Women's Health

## 2019-09-22 ENCOUNTER — Other Ambulatory Visit: Payer: Self-pay

## 2019-09-22 VITALS — BP 113/79 | HR 112 | Wt 186.6 lb

## 2019-09-22 DIAGNOSIS — Z3480 Encounter for supervision of other normal pregnancy, unspecified trimester: Secondary | ICD-10-CM

## 2019-09-22 DIAGNOSIS — O98812 Other maternal infectious and parasitic diseases complicating pregnancy, second trimester: Secondary | ICD-10-CM

## 2019-09-22 DIAGNOSIS — R8271 Bacteriuria: Secondary | ICD-10-CM

## 2019-09-22 DIAGNOSIS — Z789 Other specified health status: Secondary | ICD-10-CM

## 2019-09-22 DIAGNOSIS — O34219 Maternal care for unspecified type scar from previous cesarean delivery: Secondary | ICD-10-CM

## 2019-09-22 DIAGNOSIS — Z3A24 24 weeks gestation of pregnancy: Secondary | ICD-10-CM

## 2019-09-22 NOTE — Patient Instructions (Addendum)
[facility] - [place], [place] - [address]  [address] [place], [place] [address]  Phone:  [phone]    Glucose Tolerance Test During Pregnancy Why am I having this test? The glucose tolerance test (GTT) is done to check how your body processes sugar (glucose). This is one of several tests used to diagnose diabetes that develops during pregnancy (gestational diabetes mellitus). Gestational diabetes is a temporary form of diabetes that some women develop during pregnancy. It usually occurs during the second trimester of pregnancy and goes away after delivery. Testing (screening) for gestational diabetes usually occurs between 24 and 28 weeks of pregnancy. You may have the GTT test after having a 1-hour glucose screening test if the results from that test indicate that you may have gestational diabetes. You may also have this test if:  You have a history of gestational diabetes.  You have a history of giving birth to very large babies or have experienced repeated fetal loss (stillbirth).  You have signs and symptoms of diabetes, such as: ? Changes in your vision. ? Tingling or numbness in your hands or feet. ? Changes in hunger, thirst, and urination that are not otherwise explained by your pregnancy. What is being tested? This test measures the amount of glucose in your blood at different times during a period of 3 hours. This indicates how well your body is able to process glucose. What kind of sample is taken?  Blood samples are required for this test. They are usually collected by inserting a needle into a blood vessel. How do I prepare for this test?  For 3 days before your test, eat normally. Have plenty of carbohydrate-rich foods.  Follow instructions from your health care provider about: ? Eating or drinking restrictions on the day of the test. You may be asked to not eat or drink anything other than water (fast) starting 8-10 hours before the  test. ? Changing or stopping your regular medicines. Some medicines may interfere with this test. Tell a health care provider about:  All medicines you are taking, including vitamins, herbs, eye drops, creams, and over-the-counter medicines.  Any blood disorders you have.  Any surgeries you have had.  Any medical conditions you have. What happens during the test? First, your blood glucose will be measured. This is referred to as your fasting blood glucose, since you fasted before the test. Then, you will drink a glucose solution that contains a certain amount of glucose. Your blood glucose will be measured again 1, 2, and 3 hours after drinking the solution. This test takes about 3 hours to complete. You will need to stay at the testing location during this time. During the testing period:  Do not eat or drink anything other than the glucose solution.  Do not exercise.  Do not use any products that contain nicotine or tobacco, such as cigarettes and e-cigarettes. If you need help stopping, ask your health care provider. The testing procedure may vary among health care providers and hospitals. How are the results reported? Your results will be reported as milligrams of glucose per deciliter of blood (mg/dL) or millimoles per liter (mmol/L). Your health care provider will compare your results to normal ranges that were established after testing a large group of people (reference ranges). Reference ranges may vary among labs and hospitals. For this test, common reference ranges are:  Fasting: less than 95-105 mg/dL (8.6-5.7 mmol/L).  1 hour after drinking glucose: less than 180-190 mg/dL (84.6-96.2  mmol/L).  2 hours after drinking glucose: less than 155-165 mg/dL (4.7-8.2 mmol/L).  3 hours after drinking glucose: 140-145 mg/dL (9.5-6.2 mmol/L). What do the results mean? Results within reference ranges are considered normal, meaning that your glucose levels are well-controlled. If two or  more of your blood glucose levels are high, you may be diagnosed with gestational diabetes. If only one level is high, your health care provider may suggest repeat testing or other tests to confirm a diagnosis. Talk with your health care provider about what your results mean. Questions to ask your health care provider Ask your health care provider, or the department that is doing the test:  When will my results be ready?  How will I get my results?  What are my treatment options?  What other tests do I need?  What are my next steps? Summary  The glucose tolerance test (GTT) is one of several tests used to diagnose diabetes that develops during pregnancy (gestational diabetes mellitus). Gestational diabetes is a temporary form of diabetes that some women develop during pregnancy.  You may have the GTT test after having a 1-hour glucose screening test if the results from that test indicate that you may have gestational diabetes. You may also have this test if you have any symptoms or risk factors for gestational diabetes.  Talk with your health care provider about what your results mean. This information is not intended to replace advice given to you by your health care provider. Make sure you discuss any questions you have with your health care provider. Document Revised: 09/25/2018 Document Reviewed: 01/14/2017 Elsevier Patient Education  2020 Elsevier Inc.     Maternity Assessment Unit (MAU)  The Maternity Assessment Unit (MAU) is located at the [facility]. The address is: [address] [place], [place], [place] [address]. Please see map below for additional directions.    The Maternity Assessment Unit is designed to help you during your pregnancy, and for up to 6 weeks after delivery, with any pregnancy- or postpartum-related emergencies, if you think you are in labor, or if your water has broken. For example, if you experience  nausea and vomiting, vaginal bleeding, severe abdominal or pelvic pain, elevated blood pressure or other problems related to your pregnancy or postpartum time, please come to the Maternity Assessment Unit for assistance.    Group B Streptococcus Infection During Pregnancy Group B Streptococcus (GBS) is a type of bacteria that is often found in healthy people. It is commonly found in the rectum, vagina, and intestines. In people who are healthy and not pregnant, the bacteria rarely cause serious illness or complications. However, women who test positive for GBS during pregnancy can pass the bacteria to the baby during childbirth. This can cause serious infection in the baby after birth. Women with GBS may also have infections during their pregnancy or soon after childbirth. The infections include urinary tract infections (UTIs) or infections of the uterus. GBS also increases a woman's risk of complications during pregnancy, such as early labor or delivery, miscarriage, or stillbirth. Routine testing for GBS is recommended for all pregnant women. What are the causes? This condition is caused by bacteria called Streptococcus agalactiae. What increases the risk? You may have a higher risk for GBS infection during pregnancy if you had one during a past pregnancy. What are the signs or symptoms? In most cases, GBS infection does not cause symptoms in pregnant women. If symptoms exist, they may include:  Labor that starts before the 37th week of pregnancy.  A UTI or bladder infection. This may cause a fever, frequent urination, or pain and burning during urination.  Fever during labor. There can also be a rapid heartbeat in the mother or baby. Rare but serious symptoms of a GBS infection in women include:  Blood infection (septicemia). This may cause fever, chills, or confusion.  Lung infection (pneumonia). This may cause fever, chills, cough, rapid breathing, chest pain, or difficulty  breathing.  Bone, joint, skin, or soft tissue infection. How is this diagnosed? You may be screened for GBS between week 35 and week 37 of pregnancy. If you have symptoms of preterm labor, you may be screened earlier. This condition is diagnosed based on lab test results from:  A swab of fluid from the vagina and rectum.  A urine sample. How is this treated? This condition is treated with antibiotic medicine. Antibiotic medicine may be given:  To you when you go into labor, or as soon as your water breaks. The medicines will continue until after you give birth. If you are having a cesarean delivery, you do not need antibiotics unless your water has broken.  To your baby, if he or she requires treatment. Your health care provider will check your baby to decide if he or she needs antibiotics to prevent a serious infection. Follow these instructions at home:  Take over-the-counter and prescription medicines only as told by your health care provider.  Take your antibiotic medicine as told by your health care provider. Do not stop taking the antibiotic even if you start to feel better.  Keep all pre-birth (prenatal) visits and follow-up visits as told by your health care provider. This is important. Contact a health care provider if:  You have pain or burning when you urinate.  You have to urinate more often than usual.  You have a fever or chills.  You develop a bad-smelling vaginal discharge. Get help right away if:  Your water breaks.  You go into labor.  You have severe pain in your abdomen.  You have difficulty breathing.  You have chest pain. These symptoms may represent a serious problem that is an emergency. Do not wait to see if the symptoms will go away. Get medical help right away. Call your local emergency services (911 in the [place] Do not drive yourself to the hospital. Summary  GBS is a type of bacteria that is common in healthy people.  During pregnancy,  colonization with GBS can cause serious complications for you or your baby.  Your health care provider will screen you between 35 and 37 weeks of pregnancy to determine if you are colonized with GBS.  If you are colonized with GBS during pregnancy, your health care provider will recommend antibiotics through an IV during labor.  After delivery, your baby will be evaluated for complications related to potential GBS infection and may require antibiotics to prevent a serious infection. This information is not intended to replace advice given to you by your health care provider. Make sure you discuss any questions you have with your health care provider. Document Revised: 12/29/2018 Document Reviewed: 12/29/2018 Elsevier Patient Education  2020 [employer].     Second Trimester of Pregnancy The second trimester is from week 14 through week 27 (months 4 through 6). The second trimester is often a time when you feel your best. Your body has adjusted to being pregnant, and you begin to feel better physically. Usually, morning  sickness has lessened or quit completely, you may have more energy, and you may have an increase in appetite. The second trimester is also a time when the fetus is growing rapidly. At the end of the sixth month, the fetus is about 9 inches long and weighs about 1 pounds. You will likely begin to feel the baby move (quickening) between 16 and 20 weeks of pregnancy. Body changes during your second trimester Your body continues to go through many changes during your second trimester. The changes vary from woman to woman.  Your weight will continue to increase. You will notice your lower abdomen bulging out.  You may begin to get stretch marks on your hips, abdomen, and breasts.  You may develop headaches that can be relieved by medicines. The medicines should be approved by your health care provider.  You may urinate more often because the fetus is pressing on your  bladder.  You may develop or continue to have heartburn as a result of your pregnancy.  You may develop constipation because certain hormones are causing the muscles that push waste through your intestines to slow down.  You may develop hemorrhoids or swollen, bulging veins (varicose veins).  You may have back pain. This is caused by: ? Weight gain. ? Pregnancy hormones that are relaxing the joints in your pelvis. ? A shift in weight and the muscles that support your balance.  Your breasts will continue to grow and they will continue to become tender.  Your gums may bleed and may be sensitive to brushing and flossing.  Dark spots or blotches (chloasma, mask of pregnancy) may develop on your face. This will likely fade after the baby is born.  A dark line from your belly button to the pubic area (linea nigra) may appear. This will likely fade after the baby is born.  You may have changes in your hair. These can include thickening of your hair, rapid growth, and changes in texture. Some women also have hair loss during or after pregnancy, or hair that feels dry or thin. Your hair will most likely return to normal after your baby is born. What to expect at prenatal visits During a routine prenatal visit:  You will be weighed to make sure you and the fetus are growing normally.  Your blood pressure will be taken.  Your abdomen will be measured to track your baby's growth.  The fetal heartbeat will be listened to.  Any test results from the previous visit will be discussed. Your health care provider may ask you:  How you are feeling.  If you are feeling the baby move.  If you have had any abnormal symptoms, such as leaking fluid, bleeding, severe headaches, or abdominal cramping.  If you are using any tobacco products, including cigarettes, chewing tobacco, and electronic cigarettes.  If you have any questions. Other tests that may be performed during your second trimester  include:  Blood tests that check for: ? Low iron levels (anemia). ? High blood sugar that affects pregnant women (gestational diabetes) between 54 and 28 weeks. ? Rh antibodies. This is to check for a protein on red blood cells (Rh factor).  Urine tests to check for infections, diabetes, or protein in the urine.  An ultrasound to confirm the proper growth and development of the baby.  An amniocentesis to check for possible genetic problems.  Fetal screens for spina bifida and Down syndrome.  HIV (human immunodeficiency virus) testing. Routine prenatal testing includes screening for HIV,  unless you choose not to have this test. Follow these instructions at home: Medicines  Follow your health care provider's instructions regarding medicine use. Specific medicines may be either safe or unsafe to take during pregnancy.  Take a prenatal vitamin that contains at least 600 micrograms (mcg) of folic acid.  If you develop constipation, try taking a stool softener if your health care provider approves. Eating and drinking   Eat a balanced diet that includes fresh fruits and vegetables, whole grains, good sources of protein such as meat, eggs, or tofu, and low-fat dairy. Your health care provider will help you determine the amount of weight gain that is right for you.  Avoid raw meat and uncooked cheese. These carry germs that can cause birth defects in the baby.  If you have low calcium intake from food, talk to your health care provider about whether you should take a daily calcium supplement.  Limit foods that are high in fat and processed sugars, such as fried and sweet foods.  To prevent constipation: ? Drink enough fluid to keep your urine clear or pale yellow. ? Eat foods that are high in fiber, such as fresh fruits and vegetables, whole grains, and beans. Activity  Exercise only as directed by your health care provider. Most women can continue their usual exercise routine during  pregnancy. Try to exercise for 30 minutes at least 5 days a week. Stop exercising if you experience uterine contractions.  Avoid heavy lifting, wear low heel shoes, and practice good posture.  A sexual relationship may be continued unless your health care provider directs you otherwise. Relieving pain and discomfort  Wear a good support bra to prevent discomfort from breast tenderness.  Take warm sitz baths to soothe any pain or discomfort caused by hemorrhoids. Use hemorrhoid cream if your health care provider approves.  Rest with your legs elevated if you have leg cramps or low back pain.  If you develop varicose veins, wear support hose. Elevate your feet for 15 minutes, 3-4 times a day. Limit salt in your diet. Prenatal Care  Write down your questions. Take them to your prenatal visits.  Keep all your prenatal visits as told by your health care provider. This is important. Safety  Wear your seat belt at all times when driving.  Make a list of emergency phone numbers, including numbers for family, friends, the hospital, and police and fire departments. General instructions  Ask your health care provider for a referral to a local prenatal education class. Begin classes no later than the beginning of month 6 of your pregnancy.  Ask for help if you have counseling or nutritional needs during pregnancy. Your health care provider can offer advice or refer you to specialists for help with various needs.  Do not use hot tubs, steam rooms, or saunas.  Do not douche or use tampons or scented sanitary pads.  Do not cross your legs for long periods of time.  Avoid cat litter boxes and soil used by cats. These carry germs that can cause birth defects in the baby and possibly loss of the fetus by miscarriage or stillbirth.  Avoid all smoking, herbs, alcohol, and unprescribed drugs. Chemicals in these products can affect the formation and growth of the baby.  Do not use any products that  contain nicotine or tobacco, such as cigarettes and e-cigarettes. If you need help quitting, ask your health care provider.  Visit your dentist if you have not gone yet during your pregnancy. Use  a soft toothbrush to brush your teeth and be gentle when you floss. Contact a health care provider if:  You have dizziness.  You have mild pelvic cramps, pelvic pressure, or nagging pain in the abdominal area.  You have persistent nausea, vomiting, or diarrhea.  You have a bad smelling vaginal discharge.  You have pain when you urinate. Get help right away if:  You have a fever.  You are leaking fluid from your vagina.  You have spotting or bleeding from your vagina.  You have severe abdominal cramping or pain.  You have rapid weight gain or weight loss.  You have shortness of breath with chest pain.  You notice sudden or extreme swelling of your face, hands, ankles, feet, or legs.  You have not felt your baby move in over an hour.  You have severe headaches that do not go away when you take medicine.  You have vision changes. Summary  The second trimester is from week 14 through week 27 (months 4 through 6). It is also a time when the fetus is growing rapidly.  Your body goes through many changes during pregnancy. The changes vary from woman to woman.  Avoid all smoking, herbs, alcohol, and unprescribed drugs. These chemicals affect the formation and growth your baby.  Do not use any tobacco products, such as cigarettes, chewing tobacco, and e-cigarettes. If you need help quitting, ask your health care provider.  Contact your health care provider if you have any questions. Keep all prenatal visits as told by your health care provider. This is important. This information is not intended to replace advice given to you by your health care provider. Make sure you discuss any questions you have with your health care provider. Document Revised: 09/26/2018 Document Reviewed:  07/10/2016 Elsevier Patient Education  2020 [employer].   Preterm Labor and Birth Information  The normal length of a pregnancy is 39-41 weeks. Preterm labor is when labor starts before 37 completed weeks of pregnancy. What are the risk factors for preterm labor? Preterm labor is more likely to occur in women who:  Have certain infections during pregnancy such as a bladder infection, sexually transmitted infection, or infection inside the uterus (chorioamnionitis).  Have a shorter-than-normal cervix.  Have gone into preterm labor before.  Have had surgery on their cervix.  Are younger than age 79 or older than age 66.  Are African American.  Are pregnant with twins or multiple babies (multiple gestation).  Take street drugs or smoke while pregnant.  Do not gain enough weight while pregnant.  Became pregnant shortly after having been pregnant. What are the symptoms of preterm labor? Symptoms of preterm labor include:  Cramps similar to those that can happen during a menstrual period. The cramps may happen with diarrhea.  Pain in the abdomen or lower back.  Regular uterine contractions that may feel like tightening of the abdomen.  A feeling of increased pressure in the pelvis.  Increased watery or bloody mucus discharge from the vagina.  Water breaking (ruptured amniotic sac). Why is it important to recognize signs of preterm labor? It is important to recognize signs of preterm labor because babies who are born prematurely may not be fully developed. This can put them at an increased risk for:  Long-term (chronic) heart and lung problems.  Difficulty immediately after birth with regulating body systems, including blood sugar, body temperature, heart rate, and breathing rate.  Bleeding in the brain.  Cerebral palsy.  Learning difficulties.  Death. These risks are highest for babies who are born before 34 weeks of pregnancy. How is preterm labor  treated? Treatment depends on the length of your pregnancy, your condition, and the health of your baby. It may involve:  Having a stitch (suture) placed in your cervix to prevent your cervix from opening too early (cerclage).  Taking or being given medicines, such as: ? Hormone medicines. These may be given early in pregnancy to help support the pregnancy. ? Medicine to stop contractions. ? Medicines to help mature the baby's lungs. These may be prescribed if the risk of delivery is high. ? Medicines to prevent your baby from developing cerebral palsy. If the labor happens before 34 weeks of pregnancy, you may need to stay in the hospital. What should I do if I think I am in preterm labor? If you think that you are going into preterm labor, call your health care provider right away. How can I prevent preterm labor in future pregnancies? To increase your chance of having a full-term pregnancy:  Do not use any tobacco products, such as cigarettes, chewing tobacco, and e-cigarettes. If you need help quitting, ask your health care provider.  Do not use street drugs or medicines that have not been prescribed to you during your pregnancy.  Talk with your health care provider before taking any herbal supplements, even if you have been taking them regularly.  Make sure you gain a healthy amount of weight during your pregnancy.  Watch for infection. If you think that you might have an infection, get it checked right away.  Make sure to tell your health care provider if you have gone into preterm labor before. This information is not intended to replace advice given to you by your health care provider. Make sure you discuss any questions you have with your health care provider. Document Revised: 09/26/2018 Document Reviewed: 10/26/2015 Elsevier Patient Education  2020 [employer]

## 2019-09-22 NOTE — Progress Notes (Signed)
Patient reports fetal movement, with some pressure.

## 2019-09-22 NOTE — Progress Notes (Addendum)
Subjective:  Stephanie Wolfe is a 34 y.o. G5P4004 at [redacted]w[redacted]d being seen today for ongoing prenatal care.  She is currently monitored for the following issues for this low-risk pregnancy and has Supervision of other normal pregnancy, antepartum; Previous cesarean section complicating pregnancy, antepartum condition or complication; History of amniotic fluid embolism; Group B streptococcal bacteriuria; Traumatic injury during pregnancy in second trimester; Subchorionic hematoma in second trimester; and Language barrier on their problem list.  Patient reports no complaints.  Contractions: Not present. Vag. Bleeding: None.  Movement: Present. Denies leaking of fluid.   The following portions of the patient's history were reviewed and updated as appropriate: allergies, current medications, past family history, past medical history, past social history, past surgical history and problem list. Problem list updated.  Objective:   Vitals:   09/22/19 1343  BP: 113/79  Pulse: (!) 112  Weight: 186 lb 9.6 oz (84.6 kg)    Fetal Status: Fetal Heart Rate (bpm): 152 Fundal Height: 24 cm Movement: Present     General:  Alert, oriented and cooperative. Patient is in no acute distress.  Skin: Skin is warm and dry. No rash noted.   Cardiovascular: Normal heart rate noted  Respiratory: Normal respiratory effort, no problems with respiration noted  Abdomen: Soft, gravid, appropriate for gestational age. Pain/Pressure: Absent     Pelvic: Vag. Bleeding: None     Cervical exam deferred        Extremities: Normal range of motion.  Edema: None  Mental Status: Normal mood and affect. Normal behavior. Normal judgment and thought content.   Urinalysis:      Assessment and Plan:  Pregnancy: G5P4004 at [redacted]w[redacted]d  1. Supervision of other normal pregnancy, antepartum -pt has not yet picked up blood pressure cuff, address for [facility] given -GTT/labs next visit, future orders entered, pt advised to come fasting to next  visit  2. Previous cesarean section complicating pregnancy, antepartum condition or complication -hx 2 C/S, needs repeat  3. Group B streptococcal bacteriuria -will treat in labor -TOC today, ABX last sent 08/31/2019, pt reports she finished ABX two weeks ago  4. Language barrier -Mandarin interpreter used  I discussed the assessment and treatment plan with the patient. The patient was provided an opportunity to ask questions and all were answered. The patient agreed with the plan and demonstrated an understanding of the instructions. The patient was advised to call back or seek an in-person office evaluation/go to MAU at [facility] for any urgent or concerning symptoms. Preterm labor symptoms and general obstetric precautions including but not limited to vaginal bleeding, contractions, leaking of fluid and fetal movement were reviewed in detail with the patient. Please refer to After Visit Summary for other counseling recommendations.  Return in about 4 weeks (around 10/20/2019) for in-person ROB, GTT/labs.   Nugent, Odie Sera, NP

## 2019-09-25 LAB — URINE CULTURE

## 2019-10-06 ENCOUNTER — Ambulatory Visit: Payer: Medicaid Other

## 2019-10-07 ENCOUNTER — Other Ambulatory Visit: Payer: Self-pay

## 2019-10-07 ENCOUNTER — Ambulatory Visit (INDEPENDENT_AMBULATORY_CARE_PROVIDER_SITE_OTHER): Payer: Medicaid Other

## 2019-10-07 DIAGNOSIS — Z3480 Encounter for supervision of other normal pregnancy, unspecified trimester: Secondary | ICD-10-CM

## 2019-10-07 DIAGNOSIS — Z3A00 Weeks of gestation of pregnancy not specified: Secondary | ICD-10-CM | POA: Diagnosis not present

## 2019-10-07 DIAGNOSIS — O9A212 Injury, poisoning and certain other consequences of external causes complicating pregnancy, second trimester: Secondary | ICD-10-CM

## 2019-10-07 DIAGNOSIS — O98219 Gonorrhea complicating pregnancy, unspecified trimester: Secondary | ICD-10-CM

## 2019-10-07 MED ORDER — CEFTRIAXONE SODIUM 1 G IJ SOLR
1.0000 g | INTRAMUSCULAR | Status: DC
  Administered 2019-10-07: 1 g via INTRAMUSCULAR

## 2019-10-07 NOTE — Progress Notes (Signed)
Pt is here for Rocephin injection 1g per Joni Reining for GBS in urine. Pt informed using assistance of Mandarin interpreter. Injection given in LUOQ without difficulty. Pt advised to return tomorrow for second injection, pt voices understanding.

## 2019-10-08 ENCOUNTER — Ambulatory Visit (INDEPENDENT_AMBULATORY_CARE_PROVIDER_SITE_OTHER): Payer: Medicaid Other

## 2019-10-08 DIAGNOSIS — R8271 Bacteriuria: Secondary | ICD-10-CM

## 2019-10-08 DIAGNOSIS — N390 Urinary tract infection, site not specified: Secondary | ICD-10-CM

## 2019-10-08 MED ORDER — CEFTRIAXONE SODIUM 1 G IJ SOLR
1.0000 g | Freq: Once | INTRAMUSCULAR | Status: AC
  Administered 2019-10-08: 1 g via INTRAMUSCULAR

## 2019-10-08 NOTE — Progress Notes (Signed)
Patient presents for her second rocephin inj to treat UTI. Inj given in LUOQ requested per patient. Pt tolerated well. She has no concerns today. Ceftriaxone 1g reconstituted with 2.1ML 1% lidocaine.

## 2019-10-11 NOTE — Progress Notes (Signed)
Patient ID: Stephanie Wolfe, female   DOB: 1985/09/22, 34 y.o.   MRN: [id number] Patient seen and assessed by nursing staff during this encounter. I have reviewed the chart and agree with the documentation and plan. I have also made any necessary editorial changes.  Scheryl Darter, MD 10/11/2019 11:01 AM

## 2019-10-11 NOTE — Progress Notes (Signed)
Patient ID: Stephanie Wolfe, female   DOB: 07/13/1985, 34 y.o.   MRN: [id number] Patient seen and assessed by nursing staff during this encounter. I have reviewed the chart and agree with the documentation and plan. I have also made any necessary editorial changes.  Scheryl Darter, MD 10/11/2019 11:00 AM

## 2019-10-13 ENCOUNTER — Other Ambulatory Visit: Payer: Self-pay

## 2019-10-13 ENCOUNTER — Encounter (HOSPITAL_COMMUNITY): Payer: Self-pay | Admitting: Family Medicine

## 2019-10-13 ENCOUNTER — Inpatient Hospital Stay (HOSPITAL_COMMUNITY)
Admission: AD | Admit: 2019-10-13 | Discharge: 2019-10-13 | Disposition: A | Discharge status: Home / Self Care | Payer: Medicaid Other | Attending: Family Medicine | Admitting: Family Medicine

## 2019-10-13 DIAGNOSIS — R109 Unspecified abdominal pain: Secondary | ICD-10-CM | POA: Diagnosis present

## 2019-10-13 DIAGNOSIS — O26892 Other specified pregnancy related conditions, second trimester: Secondary | ICD-10-CM | POA: Insufficient documentation

## 2019-10-13 DIAGNOSIS — Z3A27 27 weeks gestation of pregnancy: Secondary | ICD-10-CM | POA: Diagnosis not present

## 2019-10-13 DIAGNOSIS — O99612 Diseases of the digestive system complicating pregnancy, second trimester: Secondary | ICD-10-CM

## 2019-10-13 DIAGNOSIS — R102 Pelvic and perineal pain: Secondary | ICD-10-CM | POA: Insufficient documentation

## 2019-10-13 DIAGNOSIS — K429 Umbilical hernia without obstruction or gangrene: Secondary | ICD-10-CM

## 2019-10-13 LAB — URINALYSIS, ROUTINE W REFLEX MICROSCOPIC
Bilirubin Urine: NEGATIVE
Glucose, UA: NEGATIVE mg/dL
Hgb urine dipstick: NEGATIVE
Ketones, ur: NEGATIVE mg/dL
Leukocytes,Ua: NEGATIVE
Nitrite: NEGATIVE
Protein, ur: NEGATIVE mg/dL
Specific Gravity, Urine: 1.009 (ref 1.005–1.030)
pH: 7 (ref 5.0–8.0)

## 2019-10-13 MED ORDER — COMFORT FIT MATERNITY SUPP SM MISC
1.0000 [IU] | Freq: Every day | 0 refills | Status: DC | PRN
Start: 2019-10-13 — End: 2019-11-27

## 2019-10-13 NOTE — MAU Provider Note (Signed)
Chief Complaint:  Abdominal Pain   First Provider Initiated Contact with Patient 10/13/19 2121     *Video Mandarin interpreter used for this encounter*  HPI: Stephanie Wolfe is a 34 y.o. [id number] at 46w1dwho presents to maternity admissions reporting abdominal pain. Reports mid abdominal pain that started today and only occurs when she moves to sit up. Also reports some vaginal pain that occurs with walking.  Denies contractions, n/v/d, fever, dysuria, vaginal bleeding, or LOF. Good fetal movement.   Location: abdomen Quality: sore Severity: 6/10 in pain scale Duration: 1 day Timing: intermittent Modifying factors: only occurs with act of sitting up Associated signs and symptoms: none  Pregnancy Course: [facility]. Hx c/s x 2.   Past Medical History:  Diagnosis Date  . Medical history non-contributory    OB History  Gravida Para Term Preterm AB Living  '5 4 4     4  '$ SAB TAB Ectopic Multiple Live Births          4    # Outcome Date GA Lbr Len/2nd Weight Sex Delivery Anes PTL Lv  5 Current           4 Term      CS-LTranv     3 Term      CS-LTranv     2 Term      Vag-Spont     1 Term      Vag-Spont      Past Surgical History:  Procedure Laterality Date  . CESAREAN SECTION     Family History  Problem Relation Age of Onset  . Cancer Mother   . Hypertension Father   . Cancer Maternal Grandfather   . Cancer Paternal Grandmother    Social History   Tobacco Use  . Smoking status: Never Smoker  . Smokeless tobacco: Never Used  Substance Use Topics  . Alcohol use: Not Currently  . Drug use: Never   No Known Allergies Facility-Administered Medications Prior to Admission  Medication Dose Route Frequency Provider Last Rate Last Admin  . cefTRIAXone (ROCEPHIN) injection 1 g  1 g Intramuscular Q24H HShelly Bombard MD   1 g at 10/07/19 1057   Medications Prior to Admission  Medication Sig Dispense Refill Last Dose  . Blood Pressure Monitoring (BLOOD PRESSURE KIT) DEVI 1  Device by Does not apply route as needed. 1 each 0 Past Week at Unknown time  . Prenatal Vit-Fe Fumarate-FA (PRENATAL VITAMINS PO) Take by mouth.   10/13/2019 at Unknown time  . famotidine (PEPCID) 20 MG tablet Take 20 mg by mouth 2 (two) times daily.       I have reviewed patient's Past Medical Hx, Surgical Hx, Family Hx, Social Hx, medications and allergies.   ROS:  Review of Systems  Constitutional: Negative.   Gastrointestinal: Positive for abdominal pain. Negative for constipation, diarrhea, nausea and vomiting.  Genitourinary: Positive for pelvic pain. Negative for dysuria, vaginal bleeding and vaginal discharge.    Physical Exam   Patient Vitals for the past 24 hrs:  BP Temp Temp src Pulse Resp SpO2 Height Weight  10/13/19 2144 113/67 -- -- (!) 101 15 100 % -- --  10/13/19 2122 111/70 -- -- (!) 106 -- -- -- --  10/13/19 2100 114/71 98.3 F (36.8 C) Oral (!) 101 20 -- 5' 4.5" (1.638 m) 88.5 kg    Constitutional: Well-developed, well-nourished female in no acute distress.  Cardiovascular: normal rate & rhythm, no murmur Respiratory: normal effort, lung sounds clear throughout GI:  Small ~1 cm umbilical hernia, reducible. Abd soft, non-tender, gravid appropriate for gestational age. Pos BS x 4 MS: Extremities nontender, no edema, normal ROM Neurologic: Alert and oriented x 4.  GU:    Dilation: Closed Effacement (%): Thick Cervical Position: Posterior Exam by:: Jorje Guild NP  NST:  Baseline: 150 bpm, Variability: Good {> 6 bpm), Accelerations: Non-reactive but appropriate for gestational age and Decelerations: Absent   Labs: No results found for this or any previous visit (from the past 24 hour(s)).  Imaging:  No results found.  MAU Course: Orders Placed This Encounter  Procedures  . Urinalysis, Routine w reflex microscopic  . Discharge patient   Meds ordered this encounter  Medications  . Elastic Bandages & Supports (COMFORT FIT MATERNITY SUPP SM) MISC    Sig:  1 Units by Does not apply route daily as needed.    Dispense:  1 each    Refill:  0    Order Specific Question:   Supervising Provider    Answer:   Verita Schneiders A [[id number]]    MDM: Category 1 tracing No ctx on monitor Cervix closed/thick/firm  Patient with small reducible umbilical hernia. Avoid heavy lifting & using abdominal muscles for position changes. Reviewed emergency s/s to return for incarcerated hernia.   Discussed use of maternity support belt, esp since this is her 5th pregnancy. Will give rx & bio tech info.    Assessment: 1. Umbilical hernia without obstruction and without gangrene   2. Pelvic pain affecting pregnancy in second trimester, antepartum   3. [redacted] weeks gestation of pregnancy     Plan: Discharge home in stable condition.  Rx maternity support belt Discussed reasons to return to [place]. Follow up.   Why: for maternity support belt Contact information: [address] [address] [phone]           Allergies as of 10/13/2019   No Known Allergies     Medication List    TAKE these medications   Blood Pressure Kit Devi 1 Device by Does not apply route as needed.   Comfort Fit Maternity Supp Sm Misc 1 Units by Does not apply route daily as needed.   famotidine 20 MG tablet Commonly known as: PEPCID Take 20 mg by mouth 2 (two) times daily.   PRENATAL VITAMINS PO Take by mouth.       Jorje Guild, NP 10/13/2019 9:49 PM

## 2019-10-13 NOTE — MAU Note (Signed)
PT SAYS WITH INTERPRETER [phone].   SAYS THIS AM HAD ABD PAIN- - AT 4PM- PAIN WORSE LASTED 1 HR- HURT TO WALK. PNC - HD . LAST SEX- LONG TIME. NO PAIN WITH URINATION.

## 2019-10-13 NOTE — Discharge Instructions (Signed)
PREGNANCY SUPPORT BELT: You are not alone, Seventy-five percent of women have some sort of abdominal or back pain at some point in their pregnancy. Your baby is growing at a fast pace, which means that your whole body is rapidly trying to adjust to the changes. As your uterus grows, your back may start feeling a bit under stress and this can result in back or abdominal pain that can go from mild, and therefore bearable, to severe pains that will not allow you to sit or lay down comfortably, When it comes to dealing with pregnancy-related pains and cramps, some pregnant women usually prefer natural remedies, which the market is filled with nowadays. For example, wearing a pregnancy support belt can help ease and lessen your discomfort and pain. WHAT ARE THE BENEFITS OF WEARING A PREGNANCY SUPPORT BELT? A pregnancy support belt provides support to the lower portion of the belly taking some of the weight of the growing uterus and distributing to the other parts of your body. It is designed make you comfortable and gives you extra support. Over the years, the pregnancy apparel market has been studying the needs and wants of pregnant women and they have come up with the most comfortable pregnancy support belts that woman could ever ask for. In fact, you will no longer have to wear a stretched-out or bulky pregnancy belt that is visible underneath your clothes and makes you feel even more uncomfortable. Nowadays, a pregnancy support belt is made of comfortable and stretchy materials that will not irritate your skin but will actually make you feel at ease and you will not even notice you are wearing it. They are easy to put on and adjust during the day and can be worn at night for additional support.  BENEFITS: . Relives Back pain . Relieves Abdominal Muscle and Leg Pain . Stabilizes the Pelvic Ring . Offers a Cushioned Abdominal Lift Pad . Relieves pressure on the Sciatic Nerve Within Minutes WHERE TO GET  YOUR PREGNANCY BELT: [employer] [phone] @[address]  [address] [place], [place]      Umbilical Hernia, Adult  A hernia is a bulge of tissue that pushes through an opening between muscles. An umbilical hernia happens in the abdomen, near the belly button (umbilicus). The hernia may contain tissues from the small intestine, large intestine, or fatty tissue covering the intestines (omentum). Umbilical hernias in adults tend to get worse over time, and they require surgical treatment. There are several types of umbilical hernias. You may have:  A hernia located just above or below the umbilicus (indirect hernia). This is the most common type of umbilical hernia in adults.  A hernia that forms through an opening formed by the umbilicus (direct hernia).  A hernia that comes and goes (reducible hernia). A reducible hernia may be visible only when you strain, lift something heavy, or cough. This type of hernia can be pushed back into the abdomen (reduced).  A hernia that traps abdominal tissue inside the hernia (incarcerated hernia). This type of hernia cannot be reduced.  A hernia that cuts off blood flow to the tissues inside the hernia (strangulated hernia). The tissues can start to die if this happens. This type of hernia requires emergency treatment. What are the causes? An umbilical hernia happens when tissue inside the abdomen presses on a weak area of the abdominal muscles. What increases the risk? You may have a greater risk of this condition if you:  Are obese.  Have had several pregnancies.  Have a buildup of fluid inside your abdomen (ascites).  Have had surgery that weakens the abdominal muscles. What are the signs or symptoms? The main symptom of this condition is a painless bulge at or near the belly button. A reducible hernia may be visible only when you strain, lift something heavy, or cough. Other symptoms may include:  Dull pain.  A feeling  of pressure. Symptoms of a strangulated hernia may include:  Pain that gets increasingly worse.  Nausea and vomiting.  Pain when pressing on the hernia.  Skin over the hernia becoming red or purple.  Constipation.  Blood in the stool. How is this diagnosed? This condition may be diagnosed based on:  A physical exam. You may be asked to cough or strain while standing. These actions increase the pressure inside your abdomen and force the hernia through the opening in your muscles. Your health care provider may try to reduce the hernia by pressing on it.  Your symptoms and medical history. How is this treated? Surgery is the only treatment for an umbilical hernia. Surgery for a strangulated hernia is done as soon as possible. If you have a small hernia that is not incarcerated, you may need to lose weight before having surgery. Follow these instructions at home:  Lose weight, if told by your health care provider.  Do not try to push the hernia back in.  Watch your hernia for any changes in color or size. Tell your health care provider if any changes occur.  You may need to avoid activities that increase pressure on your hernia.  Do not lift anything that is heavier than 10 lb (4.5 kg) until your health care provider says that this is safe.  Take over-the-counter and prescription medicines only as told by your health care provider.  Keep all follow-up visits as told by your health care provider. This is important. Contact a health care provider if:  Your hernia gets larger.  Your hernia becomes painful. Get help right away if:  You develop sudden, severe pain near the area of your hernia.  You have pain as well as nausea or vomiting.  You have pain and the skin over your hernia changes color.  You develop a fever. This information is not intended to replace advice given to you by your health care provider. Make sure you discuss any questions you have with your health  care provider. Document Revised: 07/17/2017 Document Reviewed: 12/03/2016 Elsevier Patient Education  [employer].

## 2019-10-20 ENCOUNTER — Encounter: Payer: Self-pay | Admitting: Family Medicine

## 2019-10-20 ENCOUNTER — Other Ambulatory Visit: Payer: Self-pay

## 2019-10-20 ENCOUNTER — Ambulatory Visit (INDEPENDENT_AMBULATORY_CARE_PROVIDER_SITE_OTHER): Payer: Medicaid Other | Admitting: Family Medicine

## 2019-10-20 ENCOUNTER — Other Ambulatory Visit: Payer: Medicaid Other

## 2019-10-20 VITALS — BP 116/76 | HR 98 | Wt 193.5 lb

## 2019-10-20 DIAGNOSIS — Z3480 Encounter for supervision of other normal pregnancy, unspecified trimester: Secondary | ICD-10-CM

## 2019-10-20 DIAGNOSIS — O34219 Maternal care for unspecified type scar from previous cesarean delivery: Secondary | ICD-10-CM

## 2019-10-20 DIAGNOSIS — Z789 Other specified health status: Secondary | ICD-10-CM

## 2019-10-20 DIAGNOSIS — R8271 Bacteriuria: Secondary | ICD-10-CM

## 2019-10-20 MED ORDER — PREPLUS 27-1 MG PO TABS: 1.0000 | ORAL_TABLET | Freq: Every day | ORAL | 13 refills | Status: AC

## 2019-10-20 NOTE — Progress Notes (Signed)
Pt is here for ROB, [redacted]w[redacted]d. 2 Hr GTT today.

## 2019-10-20 NOTE — Progress Notes (Signed)
   PRENATAL VISIT NOTE  Subjective:  Stephanie Wolfe is a 34 y.o. G5P4004 at [redacted]w[redacted]d being seen today for ongoing prenatal care.  She is currently monitored for the following issues for this low-risk pregnancy and has Supervision of other normal pregnancy, antepartum; Previous cesarean section complicating pregnancy, antepartum condition or complication; History of amniotic fluid embolism; Group B streptococcal bacteriuria; Traumatic injury during pregnancy in second trimester; and Language barrier on their problem list.  Patient reports no complaints; recently seen in MAU but reports abdominal pain resolved. Sometimes with walking longer distances she has some discomfort.  Contractions: Not present. Vag. Bleeding: None.  Movement: Present. Denies leaking of fluid.   The following portions of the patient's history were reviewed and updated as appropriate: allergies, current medications, past family history, past medical history, past social history, past surgical history and problem list.   Objective:   Vitals:   10/20/19 0819  BP: 116/76  Pulse: 98  Weight: 193 lb 8 oz (87.8 kg)    Fetal Status: Fetal Heart Rate (bpm): 142 Fundal Height: 29 cm Movement: Present     General:  Alert, oriented and cooperative. Patient is in no acute distress.  Skin: Skin is warm and dry. No rash noted.   Cardiovascular: Normal heart rate noted  Respiratory: Normal respiratory effort, no problems with respiration noted  Abdomen: Soft, gravid, appropriate for gestational age.  Pain/Pressure: Absent     Pelvic: Cervical exam deferred        Extremities: Normal range of motion.  Edema: Trace  Mental Status: Normal mood and affect. Normal behavior. Normal judgment and thought content.   Assessment and Plan:  Pregnancy: G5P4004 at [redacted]w[redacted]d 1. Supervision of other normal pregnancy, antepartum - RTC in 4 weeks, prefers in-person visits (BP cuff provided today) - Recent visit to MAU and diagnosed with reducible  umbilical hernia and given support belt/precautions - Last [place] 3/30: Normal findings; f/u as clinically indicated (placental lake previously noted but not on this scan) - 28 week labs today  - Glucose Tolerance, 2 Hours w/1 Hour - RPR - CBC - HIV Antibody (routine testing w rflx)  2. Group B streptococcal bacteriuria - Patient with GBSuria that has been treated multiple times, most recently with Rocephin 1 g x2 doses; will repeat culture for TOC  - Urine Culture  3. Previous cesarean section complicating pregnancy, antepartum condition or complication - Patient asked whether she could deliver vaginally, she had two prior c-sections at term in [place] and reports the first one was because she never dilated and was around 40w (denies induction) and the second was because of positive GBS  - Desires vaginal deliver if possible. Discussed risks of VBAC especially after 2 prior c-sections. If patient is laboring prior to 40-41 weeks, discussed that it is possible to let her TOLAC but she will not be given meds to induce or augment labor; patient verbalized understanding and VBAC consent signed.  - Will need repeat scheduled around 40-41 weeks as she would like to TOLAC   4. Language barrier - Mandarin interpreter used  Preterm labor symptoms and general obstetric precautions including but not limited to vaginal bleeding, contractions, leaking of fluid and fetal movement were reviewed in detail with the patient. Please refer to After Visit Summary for other counseling recommendations.   Return in about 4 weeks (around 11/17/2019) for LOB; in-person.  No future appointments.  Joselyn Arrow, MD

## 2019-10-20 NOTE — Addendum Note (Signed)
Addended by: Jerilynn Birkenhead on: 10/20/2019 12:11 PM   Modules accepted: Orders

## 2019-10-21 LAB — CBC
Hematocrit: 33.1 % — ABNORMAL LOW (ref 34.0–46.6)
Hemoglobin: 11.1 g/dL (ref 11.1–15.9)
MCH: 30.2 pg (ref 26.6–33.0)
MCHC: 33.5 g/dL (ref 31.5–35.7)
MCV: 90 fL (ref 79–97)
Platelets: 176 10*3/uL (ref 150–450)
RBC: 3.67 x10E6/uL — ABNORMAL LOW (ref 3.77–5.28)
RDW: 13.5 % (ref 11.7–15.4)
WBC: 10.5 10*3/uL (ref 3.4–10.8)

## 2019-10-21 LAB — GLUCOSE TOLERANCE, 2 HOURS W/ 1HR
Glucose, 1 hour: 196 mg/dL — ABNORMAL HIGH (ref 65–179)
Glucose, 2 hour: 126 mg/dL (ref 65–152)
Glucose, Fasting: 86 mg/dL (ref 65–91)

## 2019-10-21 LAB — RPR: RPR Ser Ql: NONREACTIVE

## 2019-10-21 LAB — HIV ANTIBODY (ROUTINE TESTING W REFLEX): HIV Screen 4th Generation wRfx: NONREACTIVE

## 2019-10-22 ENCOUNTER — Other Ambulatory Visit: Payer: Self-pay

## 2019-10-22 DIAGNOSIS — O24410 Gestational diabetes mellitus in pregnancy, diet controlled: Secondary | ICD-10-CM

## 2019-10-22 LAB — URINE CULTURE

## 2019-10-22 MED ORDER — CEFPODOXIME PROXETIL 100 MG PO TABS: 100.0000 mg | ORAL_TABLET | Freq: Two times a day (BID) | ORAL | 0 refills | Status: AC

## 2019-10-22 MED ORDER — ACCU-CHEK SOFTCLIX LANCETS MISC: 1.0000 | Freq: Four times a day (QID) | 12 refills | Status: DC

## 2019-10-22 MED ORDER — ACCU-CHEK GUIDE VI STRP: ORAL_STRIP | 12 refills | Status: DC

## 2019-10-22 MED ORDER — ACCU-CHEK GUIDE W/DEVICE KIT: 1.0000 | PACK | Freq: Four times a day (QID) | 0 refills | Status: DC

## 2019-10-22 NOTE — Addendum Note (Signed)
Addended by: Jerilynn Birkenhead on: 10/22/2019 02:50 PM   Modules accepted: Orders

## 2019-10-29 ENCOUNTER — Encounter: Payer: Medicaid Other | Attending: Family Medicine | Admitting: Registered"

## 2019-10-29 ENCOUNTER — Other Ambulatory Visit: Payer: Self-pay

## 2019-10-29 ENCOUNTER — Ambulatory Visit: Payer: Medicaid Other | Admitting: Registered"

## 2019-10-29 DIAGNOSIS — O24410 Gestational diabetes mellitus in pregnancy, diet controlled: Secondary | ICD-10-CM | POA: Diagnosis not present

## 2019-10-29 DIAGNOSIS — Z3A00 Weeks of gestation of pregnancy not specified: Secondary | ICD-10-CM | POA: Diagnosis not present

## 2019-10-29 DIAGNOSIS — O99810 Abnormal glucose complicating pregnancy: Secondary | ICD-10-CM | POA: Insufficient documentation

## 2019-10-29 DIAGNOSIS — Z713 Dietary counseling and surveillance: Secondary | ICD-10-CM | POA: Insufficient documentation

## 2019-10-29 NOTE — Progress Notes (Signed)
Interpreter services provided by Luis Abed (May) #[id number] via [employer] video  Patient was seen on 10/29/19 for Gestational Diabetes self-management. Patient states no history of GDM. Diet history obtained. Patient eats variety of all food groups. Beverages include water.  Patient is likely not consuming excess carbohydrates. Patient has changed diet since diagnosis, no fruit or fatty meat.   The following learning objectives were met by the patient :   States the definition of Gestational Diabetes  States why dietary management is important in controlling blood glucose  Describes the effects of carbohydrates on blood glucose levels  Demonstrates ability to create a balanced meal plan  Demonstrates carbohydrate counting   States when to check blood glucose levels  Demonstrates proper blood glucose monitoring techniques  States the effect of stress and exercise on blood glucose levels  States the importance of limiting caffeine and abstaining from alcohol and smoking  Plan:  Aim for 3 Carbohydrate Choices per meal (45 grams) +/- 1 either way  Aim for 1-2 Carbohydrate Choices per snack Begin reading food labels for Total Carbohydrate of foods If OK with your MD, consider  increasing your activity level by walking, Arm Chair Exercises or other activity daily as tolerated Begin checking Blood Glucose before breakfast and 2 hours after first bite of breakfast, lunch and dinner as directed by MD  Bring Log Book/Sheet and meter to every medical appointment  Baby Scripts: Patient not appropriate for Baby Scripts due to  language barrier  Take medication if directed by MD  Patient already has a meter but has not started testing pre breakfast and 2 hours after each meal.  Patient instructed to monitor glucose levels: FBS: 60 - 95 mg/dl 2 hour: <120 mg/dl  Patient received the following handouts:  Sample meals for Gestational Diabetes in [place]  Patient will be seen for follow-up in 1 week.

## 2019-10-30 ENCOUNTER — Encounter (HOSPITAL_COMMUNITY): Payer: Self-pay

## 2019-10-30 ENCOUNTER — Inpatient Hospital Stay (HOSPITAL_COMMUNITY)
Admission: AD | Admit: 2019-10-30 | Discharge: 2019-10-31 | Disposition: A | Discharge status: Home / Self Care | Payer: Medicaid Other | Attending: Family Medicine | Admitting: Family Medicine

## 2019-10-30 DIAGNOSIS — R109 Unspecified abdominal pain: Secondary | ICD-10-CM | POA: Insufficient documentation

## 2019-10-30 DIAGNOSIS — O26893 Other specified pregnancy related conditions, third trimester: Secondary | ICD-10-CM | POA: Insufficient documentation

## 2019-10-30 DIAGNOSIS — Z79899 Other long term (current) drug therapy: Secondary | ICD-10-CM | POA: Insufficient documentation

## 2019-10-30 DIAGNOSIS — Z3689 Encounter for other specified antenatal screening: Secondary | ICD-10-CM

## 2019-10-30 DIAGNOSIS — Z3A29 29 weeks gestation of pregnancy: Secondary | ICD-10-CM | POA: Insufficient documentation

## 2019-10-30 DIAGNOSIS — S3991XA Unspecified injury of abdomen, initial encounter: Secondary | ICD-10-CM

## 2019-10-30 NOTE — MAU Note (Signed)
Patient states around 2000 her daughter opened the car door and it hit her stomach and felt like her baby had a shaking sensation.  Then felt a pain in her lower abdomen a few hours later that would come and go every 10 minutes and occurred 3 times total.  Denies LOF/VB/current abdominal pain.   Endorses + FM.

## 2019-10-31 DIAGNOSIS — Z3A29 29 weeks gestation of pregnancy: Secondary | ICD-10-CM | POA: Diagnosis not present

## 2019-10-31 DIAGNOSIS — R109 Unspecified abdominal pain: Secondary | ICD-10-CM | POA: Diagnosis not present

## 2019-10-31 DIAGNOSIS — O26893 Other specified pregnancy related conditions, third trimester: Secondary | ICD-10-CM | POA: Diagnosis present

## 2019-10-31 DIAGNOSIS — O99891 Other specified diseases and conditions complicating pregnancy: Secondary | ICD-10-CM | POA: Diagnosis not present

## 2019-10-31 DIAGNOSIS — Z789 Other specified health status: Secondary | ICD-10-CM

## 2019-10-31 DIAGNOSIS — Z79899 Other long term (current) drug therapy: Secondary | ICD-10-CM | POA: Diagnosis not present

## 2019-10-31 DIAGNOSIS — W228XXA Striking against or struck by other objects, initial encounter: Secondary | ICD-10-CM | POA: Diagnosis not present

## 2019-10-31 LAB — URINALYSIS, ROUTINE W REFLEX MICROSCOPIC
Bilirubin Urine: NEGATIVE
Glucose, UA: NEGATIVE mg/dL
Hgb urine dipstick: NEGATIVE
Ketones, ur: NEGATIVE mg/dL
Leukocytes,Ua: NEGATIVE
Nitrite: NEGATIVE
Protein, ur: NEGATIVE mg/dL
Specific Gravity, Urine: 1.002 — ABNORMAL LOW (ref 1.005–1.030)
pH: 6 (ref 5.0–8.0)

## 2019-10-31 NOTE — MAU Note (Signed)
Fetal monitor flipped up. Monitor reapplied. FHR 150s.

## 2019-10-31 NOTE — Discharge Instructions (Signed)
Abdominal Pain During Pregnancy  Belly (abdominal) pain is common during pregnancy. There are many possible causes. Most of the time, it is not a serious problem. Other times, it can be a sign that something is wrong with the pregnancy. Always tell your doctor if you have belly pain. Follow these instructions at home:  Do not have sex or put anything in your vagina until your pain goes away completely.  Get plenty of rest until your pain gets better.  Drink enough fluid to keep your pee (urine) pale yellow.  Take over-the-counter and prescription medicines only as told by your doctor.  Keep all follow-up visits as told by your doctor. This is important. Contact a doctor if:  Your pain continues or gets worse after resting.  You have lower belly pain that: ? Comes and goes at regular times. ? Spreads to your back. ? Feels like menstrual cramps.  You have pain or burning when you pee (urinate). Get help right away if:  You have a fever or chills.  You have vaginal bleeding.  You are leaking fluid from your vagina.  You are passing tissue from your vagina.  You throw up (vomit) for more than 24 hours.  You have watery poop (diarrhea) for more than 24 hours.  Your baby is moving less than usual.  You feel very weak or faint.  You have shortness of breath.  You have very bad pain in your upper belly. Summary  Belly (abdominal) pain is common during pregnancy. There are many possible causes.  If you have belly pain during pregnancy, tell your doctor right away.  Keep all follow-up visits as told by your doctor. This is important. This information is not intended to replace advice given to you by your health care provider. Make sure you discuss any questions you have with your health care provider. Document Revised: 09/22/2018 Document Reviewed: 09/06/2016 Elsevier Patient Education  2020 [employer] Inc.  

## 2019-10-31 NOTE — MAU Provider Note (Addendum)
History     CSN: [id number]  Arrival date and time: 10/30/19 2317   First Provider Initiated Contact with Patient 10/31/19 0146      Chief Complaint  Patient presents with  . Abdominal Pain   Stephanie Wolfe is a 34 y.o. G6P5005 at 35w5dwho receives care at [facility].  She presents today for Abdominal Pain.  She states she was accidentally hit in the abdomen by a door when her daughter was opening it.  She states the pain started about 10 minutes after the incident.  She describes the pain as a menstrual cramping that is intermittent.  She states the pain has improved greatly since arriving to the hospital. She rates the pain is now a 0/10 and was a 5-6/10. Patient endorses fetal movement and denies vaginal discharge, leaking, or bleeding.       OB History    Gravida  6   Para  4   Term  4   Preterm      AB      Living  4     SAB      TAB      Ectopic      Multiple      Live Births  4           Past Medical History:  Diagnosis Date  . Medical history non-contributory     Past Surgical History:  Procedure Laterality Date  . CESAREAN SECTION      Family History  Problem Relation Age of Onset  . Cancer Mother   . Hypertension Father   . Cancer Maternal Grandfather   . Cancer Paternal Grandmother     Social History   Tobacco Use  . Smoking status: Never Smoker  . Smokeless tobacco: Never Used  Substance Use Topics  . Alcohol use: Not Currently  . Drug use: Never    Allergies: No Known Allergies  Facility-Administered Medications Prior to Admission  Medication Dose Route Frequency Provider Last Rate Last Admin  . cefTRIAXone (ROCEPHIN) injection 1 g  1 g Intramuscular Q24H HShelly Bombard MD   1 g at 10/07/19 1057   Medications Prior to Admission  Medication Sig Dispense Refill Last Dose  . Prenatal Vit-Fe Fumarate-FA (PREPLUS) 27-1 MG TABS Take 1 tablet by mouth daily. 30 tablet 13 10/30/2019 at Unknown time  . Accu-Chek Softclix  Lancets lancets 1 each by Other route 4 (four) times daily. 100 each 12   . Blood Glucose Monitoring Suppl (ACCU-CHEK GUIDE) w/Device KIT 1 Device by Does not apply route 4 (four) times daily. 1 kit 0   . Blood Pressure Monitoring (BLOOD PRESSURE KIT) DEVI 1 Device by Does not apply route as needed. 1 each 0   . Elastic Bandages & Supports (COMFORT FIT MATERNITY SUPP SM) MISC 1 Units by Does not apply route daily as needed. 1 each 0   . famotidine (PEPCID) 20 MG tablet Take 20 mg by mouth 2 (two) times daily.     .Marland Kitchenglucose blood (ACCU-CHEK GUIDE) test strip Use to check blood sugars four times a day was instructed 50 each 12   . Prenatal Vit-Fe Fumarate-FA (PRENATAL VITAMINS PO) Take by mouth.       Review of Systems  Gastrointestinal: Negative for abdominal pain.  Genitourinary: Negative for difficulty urinating, dysuria, vaginal bleeding and vaginal discharge.  Neurological: Negative for dizziness, light-headedness and headaches.   Physical Exam   Blood pressure 104/68, pulse (!) 117, temperature 98.2 F (36.8 C),  resp. rate 17, weight 86.9 kg, last menstrual period 04/06/2019, unknown if currently breastfeeding.  Physical Exam  Constitutional: She is oriented to person, place, and time. She appears well-developed and well-nourished. No distress.  HENT:  Head: Normocephalic and atraumatic.  Eyes: Conjunctivae are normal.  Cardiovascular: Normal rate, regular rhythm and normal heart sounds.  Respiratory: Effort normal and breath sounds normal. No respiratory distress.  GI: Soft. Bowel sounds are normal. There is abdominal tenderness in the suprapubic area.  Musculoskeletal:        General: Normal range of motion.     Cervical back: Normal range of motion.  Neurological: She is alert and oriented to person, place, and time.  Skin: Skin is warm and dry.  Psychiatric: She has a normal mood and affect. Her behavior is normal.    Fetal Assessment 150 bpm, Mod Var, -Decels,  +Accels Toco: No ctx graphed  MAU Course   Results for orders placed or performed during the hospital encounter of 10/30/19 (from the past 24 hour(s))  Urinalysis, Routine w reflex microscopic     Status: Abnormal   Collection Time: 10/30/19 11:30 PM  Result Value Ref Range   Color, Urine STRAW (A) YELLOW   APPearance HAZY (A) CLEAR   Specific Gravity, Urine 1.002 (L) 1.005 - 1.030   pH 6.0 5.0 - 8.0   Glucose, UA NEGATIVE NEGATIVE mg/dL   Hgb urine dipstick NEGATIVE NEGATIVE   Bilirubin Urine NEGATIVE NEGATIVE   Ketones, ur NEGATIVE NEGATIVE mg/dL   Protein, ur NEGATIVE NEGATIVE mg/dL   Nitrite NEGATIVE NEGATIVE   Leukocytes,Ua NEGATIVE NEGATIVE   No results found.  MDM PE Labs: UA EFM  Assessment and Plan  34 year old G6P5005  SIUP at 29.5weeks Cat I FT Abdominal Trauma Language Barrier  -POC reviewed. -Informed that extended monitoring will be initiated as trauma has occurred to the abdomen.   -Exam findings discussed. -Patient and husband without questions or concerns. -Will monitor and reassess as appropriate.  -NST reactive -Interpretations completed with assistance of video interpreter services: Siu [id number].   Maryann Conners MSN, CNM 10/31/2019, 1:46 AM   Reassessment (3:22 AM) 145 bpm, Mod Var, -Decels, +Accels No ctx graphed  Cat I FT  NST remains reactive Plan to discontinue EFM and discharge at 4am.  Orders placed for anticipated discharge. Nurse instructed to notify provider of any changes of concern in EFM.  Maryann Conners MSN, CNM Advanced Practice Provider, [facility]

## 2019-11-02 ENCOUNTER — Other Ambulatory Visit: Payer: Self-pay

## 2019-11-03 ENCOUNTER — Other Ambulatory Visit: Payer: Self-pay

## 2019-11-03 ENCOUNTER — Encounter: Payer: Medicaid Other | Admitting: Registered"

## 2019-11-03 ENCOUNTER — Ambulatory Visit: Payer: Medicaid Other | Admitting: Registered"

## 2019-11-03 DIAGNOSIS — O99810 Abnormal glucose complicating pregnancy: Secondary | ICD-10-CM

## 2019-11-03 DIAGNOSIS — Z713 Dietary counseling and surveillance: Secondary | ICD-10-CM | POA: Diagnosis not present

## 2019-11-03 NOTE — Progress Notes (Signed)
Interpreter services provided by Ellyn Hack [phone] connection lost during vsit and then [place] #[id number] finished visit from [employer] video service  Patient was seen on 11/03/19 for follow-up assessment and education for Gestational Diabetes. EDD 01/11/20.   Patient is testing blood glucose pre breakfast and 2 hours after lunch and dinner. Patient did not understand that she is also supposed to check after breakfast and will start checking this additional time.  Review of log sheet shows: readings WNL   The following learning objectives reviewed during follow-up visit:   Times to check blood sugar  Can liberate diet as long as blood sugar readings stay WNL  Plan:  . Continue to use logsheet and take to MD visits   Patient instructed to monitor glucose levels: FBS: 60 - 95 mg/dl 2 hour: <599 mg/dl  Patient received the following handouts:  none  Patient will be seen for follow-up in as needed.

## 2019-11-05 ENCOUNTER — Other Ambulatory Visit: Payer: Medicaid Other

## 2019-11-09 ENCOUNTER — Other Ambulatory Visit: Payer: Self-pay

## 2019-11-17 ENCOUNTER — Other Ambulatory Visit: Payer: Self-pay

## 2019-11-17 ENCOUNTER — Ambulatory Visit (INDEPENDENT_AMBULATORY_CARE_PROVIDER_SITE_OTHER): Payer: Medicaid Other | Admitting: Advanced Practice Midwife

## 2019-11-17 VITALS — BP 108/77 | HR 107 | Wt 191.0 lb

## 2019-11-17 DIAGNOSIS — Z3480 Encounter for supervision of other normal pregnancy, unspecified trimester: Secondary | ICD-10-CM

## 2019-11-17 DIAGNOSIS — O34219 Maternal care for unspecified type scar from previous cesarean delivery: Secondary | ICD-10-CM

## 2019-11-17 DIAGNOSIS — Z789 Other specified health status: Secondary | ICD-10-CM

## 2019-11-17 DIAGNOSIS — Z3A32 32 weeks gestation of pregnancy: Secondary | ICD-10-CM

## 2019-11-17 DIAGNOSIS — O24410 Gestational diabetes mellitus in pregnancy, diet controlled: Secondary | ICD-10-CM

## 2019-11-17 NOTE — Progress Notes (Signed)
   PRENATAL VISIT NOTE  Subjective:  Stephanie Wolfe is a 34 y.o. V7Q4696 at [redacted]w[redacted]d being seen today for ongoing prenatal care.  She is currently monitored for the following issues for this high-risk pregnancy and has Supervision of other normal pregnancy, antepartum; Previous cesarean section complicating pregnancy, antepartum condition or complication; History of amniotic fluid embolism; Group B streptococcal bacteriuria; Traumatic injury during pregnancy in second trimester; Language barrier; and Abnormal glucose tolerance test (GTT) during pregnancy, antepartum on their problem list.  Patient reports SOB when she lies flat on her back. She denies SOB at any other time. She denies weakness, syncope, chest pain, palpitations.  Contractions: Irritability. Vag. Bleeding: None.  Movement: Present. Denies leaking of fluid.   The following portions of the patient's history were reviewed and updated as appropriate: allergies, current medications, past family history, past medical history, past social history, past surgical history and problem list. Problem list updated.  Objective:   Vitals:   11/17/19 1337  BP: 108/77  Pulse: (!) 107  Weight: 191 lb (86.6 kg)    Fetal Status: Fetal Heart Rate (bpm): 159 Fundal Height: 31 cm Movement: Present     General:  Alert, oriented and cooperative. Patient is in no acute distress.  Skin: Skin is warm and dry. No rash noted.   Cardiovascular: Normal heart rate noted  Respiratory: Normal respiratory effort, no problems with respiration noted  Abdomen: Soft, gravid, appropriate for gestational age.  Pain/Pressure: Present     Pelvic: Cervical exam deferred        Extremities: Normal range of motion.  Edema: Trace  Mental Status: Normal mood and affect. Normal behavior. Normal judgment and thought content.       Assessment and Plan:  Pregnancy: [id number] at [redacted]w[redacted]d  1. Supervision of other normal pregnancy, antepartum   2. Diet controlled gestational  diabetes mellitus (GDM), antepartum - Scan elevated CBGs. Continue to monitor - FH appropriate - Growth scan ordered for 36 weeks per MFM guidance  3. Previous cesarean section complicating pregnancy, antepartum condition or complication - Desires TOLAC, consent signed 10/20/2019 with Dr. Morene Antu  4. Language barrier - Contract Mandarin interpreter Stephanie Wolfe present for entire visit  Preterm labor symptoms and general obstetric precautions including but not limited to vaginal bleeding, contractions, leaking of fluid and fetal movement were reviewed in detail with the patient. Please refer to After Visit Summary for other counseling recommendations.  Return in about 2 weeks (around 12/01/2019).  Future Appointments  Date Time Provider Department Center  12/01/2019  3:20 PM Sharyon Cable, CNM [facility] None    [place], [place]

## 2019-11-17 NOTE — Patient Instructions (Signed)

## 2019-11-17 NOTE — Progress Notes (Signed)
ROB c/o leg cramps and shortness of breath 2 weeks.  Patient is checking her BS at home, she needs to call her Pharmacy for refills.

## 2019-11-27 ENCOUNTER — Encounter (HOSPITAL_COMMUNITY): Payer: Self-pay | Admitting: Obstetrics and Gynecology

## 2019-11-27 ENCOUNTER — Inpatient Hospital Stay (HOSPITAL_COMMUNITY)
Admission: AD | Admit: 2019-11-27 | Discharge: 2019-11-27 | Disposition: A | Discharge status: Home / Self Care | Payer: Medicaid Other | Attending: Obstetrics and Gynecology | Admitting: Obstetrics and Gynecology

## 2019-11-27 ENCOUNTER — Other Ambulatory Visit: Payer: Self-pay

## 2019-11-27 DIAGNOSIS — Z8249 Family history of ischemic heart disease and other diseases of the circulatory system: Secondary | ICD-10-CM | POA: Insufficient documentation

## 2019-11-27 DIAGNOSIS — R109 Unspecified abdominal pain: Secondary | ICD-10-CM | POA: Diagnosis present

## 2019-11-27 DIAGNOSIS — R102 Pelvic and perineal pain: Secondary | ICD-10-CM | POA: Diagnosis not present

## 2019-11-27 DIAGNOSIS — O26899 Other specified pregnancy related conditions, unspecified trimester: Secondary | ICD-10-CM

## 2019-11-27 DIAGNOSIS — O26893 Other specified pregnancy related conditions, third trimester: Secondary | ICD-10-CM | POA: Diagnosis not present

## 2019-11-27 DIAGNOSIS — Z3A33 33 weeks gestation of pregnancy: Secondary | ICD-10-CM

## 2019-11-27 LAB — URINALYSIS, ROUTINE W REFLEX MICROSCOPIC
Bilirubin Urine: NEGATIVE
Glucose, UA: NEGATIVE mg/dL
Hgb urine dipstick: NEGATIVE
Ketones, ur: NEGATIVE mg/dL
Leukocytes,Ua: NEGATIVE
Nitrite: NEGATIVE
Protein, ur: NEGATIVE mg/dL
Specific Gravity, Urine: 1.016 (ref 1.005–1.030)
pH: 6 (ref 5.0–8.0)

## 2019-11-27 MED ORDER — COMFORT FIT MATERNITY SUPP SM MISC
1.0000 [IU] | Freq: Every day | 0 refills | Status: DC | PRN
Start: 2019-11-27 — End: 2020-04-27

## 2019-11-27 NOTE — MAU Provider Note (Signed)
Chief Complaint:  Abdominal Pain   First Provider Initiated Contact with Patient 11/27/19 1132     HPI: Stephanie Wolfe is a 34 y.o. G5P4004 at 53w4dwho presents to maternity admissions reporting abdominal pain. Pain started yesterday. Reports pain only occurs with walking and lying flat. Was previously prescribed a maternity support belt but hasn't picked it up yet. Denies any other symptoms.   Location: abdomen Quality: sharp Severity: 7/10 in pain scale Duration: 2 days Timing: intermittent Modifying factors: occurs with walking & lying flat Associated signs and symptoms: none  Pregnancy Course: [facility]  Past Medical History:  Diagnosis Date  . Medical history non-contributory    OB History  Gravida Para Term Preterm AB Living  '5 4 4     4  '$ SAB TAB Ectopic Multiple Live Births          4    # Outcome Date GA Lbr Len/2nd Weight Sex Delivery Anes PTL Lv  5 Current           4 Term      CS-LTranv     3 Term      CS-LTranv     2 Term      Vag-Spont     1 Term      Vag-Spont      Past Surgical History:  Procedure Laterality Date  . CESAREAN SECTION     Family History  Problem Relation Age of Onset  . Cancer Mother   . Hypertension Father   . Cancer Maternal Grandfather   . Cancer Paternal Grandmother    Social History   Tobacco Use  . Smoking status: Never Smoker  . Smokeless tobacco: Never Used  Vaping Use  . Vaping Use: Never used  Substance Use Topics  . Alcohol use: Not Currently  . Drug use: Never   No Known Allergies No medications prior to admission.    I have reviewed patient's Past Medical Hx, Surgical Hx, Family Hx, Social Hx, medications and allergies.   ROS:  Review of Systems  Constitutional: Negative.   Gastrointestinal: Positive for abdominal pain. Negative for constipation, diarrhea, nausea and vomiting.  Genitourinary: Negative.     Physical Exam   Patient Vitals for the past 24 hrs:  BP Temp Temp src Pulse Resp SpO2 Weight  11/27/19  1139 111/65 -- -- 92 17 -- --  11/27/19 1101 114/67 98.1 F (36.7 C) Oral 98 16 100 % 85.7 kg    Constitutional: Well-developed, well-nourished female in no acute distress.  Cardiovascular: normal rate & rhythm, no murmur Respiratory: normal effort, lung sounds clear throughout GI: Abd soft, non-tender, gravid appropriate for gestational age. Pos BS x 4 MS: Extremities nontender, no edema, normal ROM Neurologic: Alert and oriented x 4.  GU:    Dilation: Closed Effacement (%): Thick Cervical Position: Posterior Exam by:: EJorje Guild NP  NST:  Baseline: 150 bpm, Variability: Good {> 6 bpm), Accelerations: Reactive and Decelerations: Absent   Labs: Results for orders placed or performed during the hospital encounter of 11/27/19 (from the past 24 hour(s))  Urinalysis, Routine w reflex microscopic     Status: Abnormal   Collection Time: 11/27/19 10:50 AM  Result Value Ref Range   Color, Urine YELLOW YELLOW   APPearance HAZY (A) CLEAR   Specific Gravity, Urine 1.016 1.005 - 1.030   pH 6.0 5.0 - 8.0   Glucose, UA NEGATIVE NEGATIVE mg/dL   Hgb urine dipstick NEGATIVE NEGATIVE   Bilirubin Urine NEGATIVE NEGATIVE  Ketones, ur NEGATIVE NEGATIVE mg/dL   Protein, ur NEGATIVE NEGATIVE mg/dL   Nitrite NEGATIVE NEGATIVE   Leukocytes,Ua NEGATIVE NEGATIVE    Imaging:  No results found.  MAU Course: Orders Placed This Encounter  Procedures  . Urinalysis, Routine w reflex microscopic  . Discharge patient   Meds ordered this encounter  Medications  . Elastic Bandages & Supports (COMFORT FIT MATERNITY SUPP SM) MISC    Sig: 1 Units by Does not apply route daily as needed.    Dispense:  1 each    Refill:  0    Order Specific Question:   Supervising Provider    Answer:   CONSTANT, PEGGY [[id number]]    MDM: Reactive NST Cervix closed/thick Pt denies contractions  Recommend maternity support belt. Patient requesting another rx; can't find the other one.   Assessment: 1. Pain of  round ligament affecting pregnancy, antepartum   2. [redacted] weeks gestation of pregnancy     Plan: Discharge home in stable condition.  PTL precautions Rx maternity support belt    Follow-up Information    [facility]. Follow up.   Contact information: [address] [address] [phone]               Allergies as of 11/27/2019   No Known Allergies     Medication List    TAKE these medications   Accu-Chek Guide test strip Generic drug: glucose blood Use to check blood sugars four times a day was instructed   Accu-Chek Guide w/Device Kit 1 Device by Does not apply route 4 (four) times daily.   Accu-Chek Softclix Lancets lancets 1 each by Other route 4 (four) times daily.   Blood Pressure Kit Devi 1 Device by Does not apply route as needed.   Comfort Fit Maternity Supp Sm Misc 1 Units by Does not apply route daily as needed.   famotidine 20 MG tablet Commonly known as: PEPCID Take 20 mg by mouth 2 (two) times daily.   PrePLUS 27-1 MG Tabs Take 1 tablet by mouth daily. What changed: Another medication with the same name was removed. Continue taking this medication, and follow the directions you see here.       Jorje Guild, NP 11/27/2019 1:41 PM

## 2019-11-27 NOTE — MAU Note (Signed)
.   Stephanie Wolfe is a 34 y.o. at [redacted]w[redacted]d here in MAU reporting: Patient reporting vaginal/pelvic pain that she describes as sharp and pressure that began at lunch time yesterday. Denies picking up pregnancy support belt. She also reports that her baby is kicking her a lot. No VB or LOF. Endorses good fetal movement.   Pain score: 8 Vitals:   11/27/19 1101  BP: 114/67  Pulse: 98  Resp: 16  Temp: 98.1 F (36.7 C)  SpO2: 100%     FHT:156 Lab orders placed from triage: UA

## 2019-11-27 NOTE — Discharge Instructions (Signed)

## 2019-12-01 ENCOUNTER — Ambulatory Visit (INDEPENDENT_AMBULATORY_CARE_PROVIDER_SITE_OTHER): Payer: Medicaid Other | Admitting: Certified Nurse Midwife

## 2019-12-01 ENCOUNTER — Other Ambulatory Visit: Payer: Self-pay

## 2019-12-01 ENCOUNTER — Encounter: Payer: Self-pay | Admitting: Certified Nurse Midwife

## 2019-12-01 VITALS — BP 110/71 | HR 99 | Wt 190.0 lb

## 2019-12-01 DIAGNOSIS — O24410 Gestational diabetes mellitus in pregnancy, diet controlled: Secondary | ICD-10-CM | POA: Insufficient documentation

## 2019-12-01 DIAGNOSIS — Z3480 Encounter for supervision of other normal pregnancy, unspecified trimester: Secondary | ICD-10-CM

## 2019-12-01 DIAGNOSIS — O34219 Maternal care for unspecified type scar from previous cesarean delivery: Secondary | ICD-10-CM

## 2019-12-01 DIAGNOSIS — Z3A34 34 weeks gestation of pregnancy: Secondary | ICD-10-CM

## 2019-12-01 DIAGNOSIS — Z789 Other specified health status: Secondary | ICD-10-CM

## 2019-12-01 NOTE — Patient Instructions (Signed)
AREA PEDIATRIC/FAMILY PRACTICE PHYSICIANS  [place] ([address]) . [facility] o Chambliss, MD; Eniola, MD; Hale, MD; Hensel, MD; McDiarmid, MD; McIntyer, MD; Neal, MD; Walden, MD o [address] [place], [place] [address] o [phone] o Mon-Fri 8:30-12:30, 1:30-5:00 o Providers come to see babies at [facility] o Accepting Medicaid . [facility] o Limited providers who accept newborns: Koirala, MD; Morrow, MD; Wolters, MD o [address], [place], [place] [address] o [phone] o Mon-Fri 8:00-5:30 o Babies seen by providers at [facility] o Does NOT accept Medicaid o Please call early in hospitalization for appointment (limited availability)  . [facility] o Mulberry, MD o [address] [place], [place] [address] o [phone] o Mon, Tue, Thur, Fri 8:30-5:00, Wed 10:00-7:00 (closed 1-2pm) o Babies seen by [facility] providers o Accepting Medicaid . [facility] - Pediatrician o Rubin, MD o [address], [place], [place] [address] o [phone] o Mon-Fri 8:30-5:00, Sat 8:30-12:00 o Provider comes to see babies at [facility] o Accepting Medicaid o Must have been referred from current patients or contacted office prior to delivery . [facility] ([facility]) o Brown, MD; Chandler, MD; Ettefagh, MD; [place], MD; Lester, MD; McCormick, MD; McQueen, MD; Prose, MD; Simha, MD; Stanley, MD; Stryffeler, NP; Tebben, NP o [address], [place], [place] [address] o [phone] o Mon, Tue, Thur, Fri 8:30-5:30, Wed 9:30-5:30, Sat 8:30-12:30 o Babies seen by [facility] providers o Accepting Medicaid o Only accepting infants of first-time parents or siblings of current patients o Hospital discharge coordinator will make follow-up appointment . Jack Amos o [address],  [place], [place]  [address] o [phone]   Fax - [phone] . [facility] o [address], [place], [place]  [address] o Phone - [phone]   Fax - [phone] . Shilpa Gosrani o [address], [place], [place]  [address] o [phone]  [place] ([address]) . [facility] o Bates, MD; Brassfield, MD; Cooper, Cox, MD; MD; Davis, MD; Dovico, MD; Ettefaugh, MD; Little, MD; Lowe, MD; Keiffer, MD; Melvin, MD; Sumner, MD; Williams, MD o [address], [place], [place] [address] o [phone] o Mon-Fri 8:30-5:00 (extended evenings Mon-Thur as needed), Sat-Sun 10:00-1:00 o Providers come to see babies at [facility] o Accepting Medicaid for families of first-time babies and families with all children in the household age 3 and under. Must register with office prior to making appointment (M-F only). . [facility] o Henson, NP; Knapp, MD; Lalonde, MD; Tysinger, PA o [address] [place], [place] [address] o [phone] o Mon-Fri 8:00-5:00 o Babies seen by providers at [facility] o Does NOT accept Medicaid/Commercial Insurance Only . [facility] ([facility])  o Artis, MD; Barnes, MD; Bratton, MD; Coccaro, MD; Lockett Gardner, MD; Kramer, MD; Marshall, MD; Netherton, MD; Poleto, MD; Skinner, MD o [address] [place], [place] [address] o [phone] o Mon-Fri 8:30-5:30, Sat (Oct.-Mar.) 9:00-1:00 o Babies seen by providers at [facility] o Accepting Medicaid  [place] ([address]) . [facility] o Reid, MD; Warner, MD o [address], [place],  [address] o [phone] o Mon-Fri 8:30-5:00, Sat 8:30-12:00 o Providers come to see babies at [facility] o Does NOT accept Medicaid . [facility] o Becker, PA; Hagler, MD; Scifres, PA; Sun, MD; Swayne, MD o [address],  [place], [place] [address] o [phone] o Mon-Fri 8:00-5:00 o Babies seen by providers at [facility] o Does NOT accept Medicaid o Only accepting babies of parents who are patients o Please call early in hospitalization for appointment (limited availability) . [facility] o Clark, MD; Frye, MD; Kelleher, MD; Mack, NP; Miller, MD; O'Keller, MD; Patterson, NP; Pudlo, MD; Puzio, MD; Thomas, MD; Tucker, MD; Twiselton, MD o [address], [place], [place] [address] o [phone] o Mon-Fri 8:00-5:00, Sat 9:00-12:00 o Providers come to see babies at [facility] o Does NOT accept Medicaid  [place] ([address]) . [facility] o Limited providers accepting new patients: Brake, NP; Wharton, PA o [address], [place], [place] [address] o [phone] o Mon-Fri 8:00-5:00 o Babies seen by providers at [facility] o Does NOT accept Medicaid o Only accepting babies of parents who are patients o Please call early in hospitalization for appointment (limited availability) . [facility] o Gay, MD; Quinlan, MD o [address] [place], [place] [address] o [phone] (press [phone] to schedule appointment) o Mon-Fri 8:00-5:00 o Providers come to see babies at [facility] o Does NOT accept Medicaid . [facility] o Mazer, MD o [address] [place], [place] [address] o [phone] o Mon-Fri 8:30-5:00 (lunch 12:30-1:00), extended hours by appointment only Wed 5:00-6:30 o Babies seen by [facility] providers o Accepting Medicaid . [facility] o Banks, MD; Jordan, MD; Koberlein, MD o [address], [place], [place] [address] o [phone] o Mon-Fri 8:00-5:00 o Babies seen by [facility] providers o Does NOT accept Medicaid . [facility] o Parker, MD; Hunter, MD; Wallace, DO o [address] [place], [place]  [address] o [phone] o Mon-Fri 8:00-5:00 o Babies seen by [facility] providers o Does NOT accept Medicaid . [facility] o Brandon, PA; Brecken, PA; Christy, NP; Dees, MD; DeClaire, MD; DeWeese, MD; Hansen, NP; Mills, NP; Parrish, NP; Smoot, NP; Summer, MD; Vapne, MD o [address] [place], [place] [address] o [phone] o Mon-Fri 8:30-5:00, Sat 10:00-1:00 o Providers come to see babies at [facility] o Does NOT accept Medicaid o Free prenatal information session Tuesdays at 4:45pm . [facility] o Bouska, MD; Gordon, PA; Jeffery, PA; Weber, PA o [address] [place] [address] o [phone] o Mon-Fri 7:30-5:30 o Babies seen by [facility] providers . [facility] o [address], [place], [place]  [address] o [phone]   Fax - [phone]  [place] ([address] & [address]) . [facility] o Reese, MD o [address] [place], [place] [address] o [phone] o Mon-Thur 8:00-6:00 o Providers come to see babies at [facility] o Accepting Medicaid . [facility] o Anderson, NP; Badger, MD; Beal, PA; Spencer, PA o [address] [place], [place] [address] o [phone] o Mon-Thur 7:30-7:30, Fri 7:30-4:30 o Babies seen by [facility] providers o Accepting Medicaid . [facility] o Agbuya, MD; Klett, NP; Romgoolam, MD o [address], [place], [place] [address] o [phone] o Mon-Fri 8:30-5:00, Sat 8:30-12:00 o Providers come to see babies at [facility] o Accepting Medicaid o Must have "Meet & Greet" appointment at office prior to delivery . [facility] ([facility] o McCord,   MD; Wallace, MD; Wood, MD o [address], [place], [place] [address] o [phone] o Mon-Wed 8:00-6:00, Thur-Fri 8:00-5:00, Sat 9:00-12:00 o Providers come to  see babies at [facility] o Does NOT accept Medicaid o Only accepting siblings of current patients . [facility]  o [address], [place], [place]  [address] o [phone]   Fax - [phone] . [facility] o [address], [place], [place]  [address] o [phone]   Fax - [phone]  [place] ([address] & [address]) . [facility] o Cirigliano, DO; Matthews, DO o [address] [place], [place] [address] o [phone] o Mon-Fri 7:00-5:00 o Babies seen by [facility] providers o Does NOT accept Medicaid . [facility] o Briscoe, MD; Howley, PA; Moreira, PA o [address], [place], [place] [address] o [phone] o Mon-Fri 8:00-5:00 o Babies seen by [facility] providers o Accepting Medicaid . [facility] o Boyd, MD; Church, PA; Jones, NP; [place], PA o [address], [place], [place] [address] o [phone] o Mon-Fri 8:00-5:00 o Babies seen by providers at [facility] o Accepting Medicaid  [place] ([address]) . [facility] [place] o Wendling, DO o [address] [place], [place] [address] o [phone] o Mon-Fri 8:00-5:00 o Babies seen by [facility] providers o Does NOT accept Medicaid o Limited availability, please call early in hospitalization to schedule follow-up . [facility] o Calderon, PA; Cummings, MD; Dillard, MD; Martin, PA; Olson, MD; VanDeven, PA o [address], [place], [place] [address] o [phone] o Mon-Fri 8:30-5:00, Sat 9:00-12:00 o Babies seen by providers at [facility] o Accepting Medicaid o Please register online then schedule online or call office o [email or web address] . [facility] ([facility] o Hunter, NP; Kumar, MD; Martin Rogers, PA o [address], [place], [place] [address] o [phone] o Mon-Fri 8:00-5:00 o Babies seen by providers at [facility] o Accepting Medicaid . [facility] ([facility] o Dothan, MD; Kristi Fleenor, NP; West, MD o [address], [place], [place] [address] o [phone] o Mon-Fri 8:00-5:30, Sat&Sun by appointment (phones open at 8:30) o Babies seen by [facility] providers o Accepting Medicaid o Must be a first-time baby or sibling of current patient . [facility] [place]  o [address], [place], [place]  [address] o [phone]   Fax - [phone]  [place] ([address]) . [facility] o Brown, PA; Cowen, PA; Rice, MD; Helton, PA; Spry, MD o [address] [place], [place] [address] o [phone] o Mon-Thur 8:00-7:00, Fri 8:00-5:00, Sat 8:00-12:00, Sun 9:00-12:00 o Babies seen by [facility] providers o Accepting Medicaid . [facility] o Coe-Goins, MD; Marshall, MD; Pierre-Louis, MD o [address], [place], [place] [address] o [phone] o Mon-Thur 8:00-5:00 o Babies seen by providers at [facility] o Accepting Medicaid . [facility] o Bratton, MD; Coe-Goins, MD; Hayes, MD; Lewis, MD; List, MD; Lott, MD; Marshall, MD; Moran, MD; O'Neal, MD; Pierre-Louis, MD; Pitonzo, MD; Scholer, MD; Spangle, MD o [address] [place], [place]   [address] o [phone] o Mon-Fri 8:00-5:30, Sat (Oct.-Mar.) 9:00-1:00 o Babies seen by providers at [facility] o Accepting Medicaid o Must fill out new patient packet, available online at [email or web address] . [facility] ([facility]) o Friddle, NP; Harris, NP; Kelly, NP; Logan, MD;  Melvin, PA; Poth, MD; Ramadoss, MD; Stanton, NP o [address], [place], [place] [address] o [phone] o Mon-Thur 8:00-5:30, Fri 8:00-5:00 o Babies seen by providers at [facility] o Accepting Medicaid  [place] ([address]) . [facility] o Dixon, PA; , MD; Pickard, MD; Tapia, PA o [address], [place], [place] [address] o [phone] o Mon-Fri 8:00-5:00 o Babies seen by providers at [facility] o Accepting Medicaid   [place] ([address]) . [facility] [place] o Masneri, DO; Meyers, MD; Nelson, PA o [address], [place], [place] [address] o [phone] o Mon-Fri 8:00-5:00 o Babies seen by providers at [facility] o Does NOT accept Medicaid o Limited appointment availability, please call early in hospitalization  . [facility] [place] o Kunedd, DO; McGowen, MD o [address], [place], [place] [address] o [phone] o Mon-Fri 8:00-5:00 o Babies seen by [facility] providers o Does NOT accept Medicaid . [facility] [place] o Cameron, MD; MacDonald, MD; Michaels, PA; Nayak, MD o [address] [place] [address], [place], [place] [address] o [phone] o Mon-Fri 8:00-5:00 o After hours clinic ([address] [place], [place] [address]) [phone] Mon-Fri 5:00-8:00, Sat 12:00-6:00, Sun 10:00-4:00 o Babies seen by [facility] providers o Accepting Medicaid . [facility] [place] o [address], [place], [place]  [address] o [phone]   Fax - [phone]  [place] ([address]) . [facility] [place] [facility] o Andy, MD o [address], [place], [place] [address] o [phone] o Mon-Fri 8:00-5:00 o Babies seen by [facility] providers o Does NOT accept Medicaid . [facility] [place] ([facility] [place]) o Eksir, MD o [address], [place], [place]  [address] o [phone] o Mon-Thur 8:00-7:00, Fri 8:00-5:00, Sat 8:00-12:00 o Babies seen by providers at [facility] o Accepting Medicaid - but does not have vaccinations in office (must be received elsewhere) o Limited availability, please call early in hospitalization  [place] ([address]) . [facility]  o Charlene Flemming, MD o [address], [place] [address] o [phone]  Fax [phone]   

## 2019-12-01 NOTE — Progress Notes (Signed)
PRENATAL VISIT NOTE  Subjective:  Stephanie Wolfe is a 34 y.o. G5P4004 at [redacted]w[redacted]d being seen today for ongoing prenatal care.  She is currently monitored for the following issues for this high-risk pregnancy and has Supervision of other normal pregnancy, antepartum; Previous cesarean section complicating pregnancy, antepartum condition or complication; History of amniotic fluid embolism; Group B streptococcal bacteriuria; Traumatic injury during pregnancy in second trimester; Language barrier; Abnormal glucose tolerance test (GTT) during pregnancy, antepartum; and Diet controlled gestational diabetes mellitus (GDM) in third trimester on their problem list.  Patient reports occasional contractions.  Contractions: Irregular. Vag. Bleeding: None.  Movement: Present. Denies leaking of fluid.   The following portions of the patient's history were reviewed and updated as appropriate: allergies, current medications, past family history, past medical history, past social history, past surgical history and problem list.   Objective:   Vitals:   12/01/19 1510  BP: 110/71  Pulse: 99  Weight: 190 lb (86.2 kg)    Fetal Status: Fetal Heart Rate (bpm): 145 Fundal Height: 36 cm Movement: Present     General:  Alert, oriented and cooperative. Patient is in no acute distress.  Skin: Skin is warm and dry. No rash noted.   Cardiovascular: Normal heart rate noted  Respiratory: Normal respiratory effort, no problems with respiration noted  Abdomen: Soft, gravid, appropriate for gestational age.  Pain/Pressure: Present     Pelvic: Cervical exam deferred        Extremities: Normal range of motion.  Edema: Trace  Mental Status: Normal mood and affect. Normal behavior. Normal judgment and thought content.   Assessment and Plan:  Pregnancy: G5P4004 at [redacted]w[redacted]d 1. Supervision of other normal pregnancy, antepartum - Patient doing well, reports occasional contractions, was seen in MAU on 6/11 for contractions  -  Reviewed obstetric hx with patient including dates of deliveries and types of deliveries  - Routine prenatal care - Anticipatory guidance on upcoming appointments including GBS at next visit, discussed recommendations of antibiotics during labor if GBS positive   2. Previous cesarean section complicating pregnancy, antepartum condition or complication - Patient had initial C/S in 2009 for failure to progress, 2 successful VBACs after initial C/S, then 2nd C/S in 2018 for breech presentation  - Patient reports that she initially wanted to [facility], consent signed for TOLAC on 5/4 with Dr Morene Antu - Patient states that she no longer thinks she wants to [facility], prefers repeat C/S. Discussed with patient r/b of TOLAC vs RCS. Discussed with patient that if she has RCS then additional deliveries will be vis C/S. Discussed with patient d/t hx of C/S x2 that she would need to go into [facility] in order to have TOLAC.  - Patient given time to discuss at home with husband. Next appointment to be scheduled with MD in order to schedule RCS.   3. Diet controlled gestational diabetes mellitus (GDM) in third trimester - GDM well controlled on diet  - Fasting range from 80-90 with no elevated CBGs  - 2hr PP range from 83-120 with 2 elevated CBGs of 125 and 126  - Encouraged patient that she is doing great, to continue diet and logging of CBGs  - [place] [facility] OB FOLLOW UP; Future  4. Language barrier - Interpreter at bedside throughout visit   Preterm labor symptoms and general obstetric precautions including but not limited to vaginal bleeding, contractions, leaking of fluid and fetal movement were reviewed in detail with the patient. Please refer to After Visit Summary for other counseling  recommendations.   Return in about 2 weeks (around 12/15/2019) for ROB/GBS- with MD .  Future Appointments  Date Time Provider [place]  12/15/2019  1:00 PM Griffin Basil, MD [place] None    Lajean Manes, CNM

## 2019-12-11 ENCOUNTER — Inpatient Hospital Stay (EMERGENCY_DEPARTMENT_HOSPITAL)
Admission: AD | Admit: 2019-12-11 | Discharge: 2019-12-11 | Disposition: A | Discharge status: Home / Self Care | Payer: Medicaid Other | Source: Home / Self Care | Attending: Obstetrics and Gynecology | Admitting: Obstetrics and Gynecology

## 2019-12-11 ENCOUNTER — Other Ambulatory Visit: Payer: Self-pay

## 2019-12-11 ENCOUNTER — Encounter (HOSPITAL_COMMUNITY): Payer: Self-pay | Admitting: Obstetrics and Gynecology

## 2019-12-11 ENCOUNTER — Inpatient Hospital Stay (HOSPITAL_COMMUNITY)
Admission: AD | Admit: 2019-12-11 | Discharge: 2019-12-11 | Disposition: A | Discharge status: Home / Self Care | Payer: Medicaid Other | Attending: Obstetrics and Gynecology | Admitting: Obstetrics and Gynecology

## 2019-12-11 DIAGNOSIS — O24419 Gestational diabetes mellitus in pregnancy, unspecified control: Secondary | ICD-10-CM | POA: Insufficient documentation

## 2019-12-11 DIAGNOSIS — Z8249 Family history of ischemic heart disease and other diseases of the circulatory system: Secondary | ICD-10-CM | POA: Insufficient documentation

## 2019-12-11 DIAGNOSIS — Z3A35 35 weeks gestation of pregnancy: Secondary | ICD-10-CM

## 2019-12-11 DIAGNOSIS — O479 False labor, unspecified: Secondary | ICD-10-CM

## 2019-12-11 DIAGNOSIS — R109 Unspecified abdominal pain: Secondary | ICD-10-CM | POA: Diagnosis not present

## 2019-12-11 DIAGNOSIS — O4703 False labor before 37 completed weeks of gestation, third trimester: Secondary | ICD-10-CM

## 2019-12-11 DIAGNOSIS — O34211 Maternal care for low transverse scar from previous cesarean delivery: Secondary | ICD-10-CM | POA: Insufficient documentation

## 2019-12-11 DIAGNOSIS — O34219 Maternal care for unspecified type scar from previous cesarean delivery: Secondary | ICD-10-CM | POA: Insufficient documentation

## 2019-12-11 DIAGNOSIS — Z3689 Encounter for other specified antenatal screening: Secondary | ICD-10-CM | POA: Insufficient documentation

## 2019-12-11 DIAGNOSIS — O26893 Other specified pregnancy related conditions, third trimester: Secondary | ICD-10-CM | POA: Diagnosis not present

## 2019-12-11 DIAGNOSIS — O26853 Spotting complicating pregnancy, third trimester: Secondary | ICD-10-CM | POA: Insufficient documentation

## 2019-12-11 LAB — URINALYSIS, ROUTINE W REFLEX MICROSCOPIC
Bilirubin Urine: NEGATIVE
Glucose, UA: NEGATIVE mg/dL
Hgb urine dipstick: NEGATIVE
Ketones, ur: NEGATIVE mg/dL
Leukocytes,Ua: NEGATIVE
Nitrite: NEGATIVE
Protein, ur: NEGATIVE mg/dL
Specific Gravity, Urine: 1.004 — ABNORMAL LOW (ref 1.005–1.030)
pH: 7 (ref 5.0–8.0)

## 2019-12-11 NOTE — MAU Note (Signed)
Patient sis not sign AVS when discharged.

## 2019-12-11 NOTE — MAU Note (Signed)
Pt reports to MAU c/o abdominal pain that comes and goes a couple times a hour pt states when she has the pain her belly gets very hard. Pt denies bleeding or LOF. +FM.

## 2019-12-11 NOTE — MAU Provider Note (Signed)
History     CSN: [id number]  Arrival date and time: 12/11/19 1333   First Provider Initiated Contact with Patient 12/11/19 1425      Chief Complaint  Patient presents with  . Vaginal Bleeding  . Abdominal Pain   33 y.o. G5P4004 _0 .4 wks presenting with spotting. Reports seeing blood when she wiped around 10 am. Denies LOF. Reports ctx q20-30 min. +FM. Was seen in MAU last night for ctx and had cervical exam. Her pregnancy is complicated by previous CS x2, previous VBAC, and A1GDM.   OB History    Gravida  5   Para  4   Term  4   Preterm      AB      Living  4     SAB      TAB      Ectopic      Multiple      Live Births  4           Past Medical History:  Diagnosis Date  . Medical history non-contributory     Past Surgical History:  Procedure Laterality Date  . CESAREAN SECTION      Family History  Problem Relation Age of Onset  . Cancer Mother   . Hypertension Father   . Cancer Maternal Grandfather   . Cancer Paternal Grandmother     Social History   Tobacco Use  . Smoking status: Never Smoker  . Smokeless tobacco: Never Used  Vaping Use  . Vaping Use: Never used  Substance Use Topics  . Alcohol use: Not Currently  . Drug use: Never    Allergies: No Known Allergies  Medications Prior to Admission  Medication Sig Dispense Refill Last Dose  . Accu-Chek Softclix Lancets lancets 1 each by Other route 4 (four) times daily. 100 each 12   . Blood Glucose Monitoring Suppl (ACCU-CHEK GUIDE) w/Device KIT 1 Device by Does not apply route 4 (four) times daily. 1 kit 0   . Blood Pressure Monitoring (BLOOD PRESSURE KIT) DEVI 1 Device by Does not apply route as needed. 1 each 0   . Elastic Bandages & Supports (COMFORT FIT MATERNITY SUPP SM) MISC 1 Units by Does not apply route daily as needed. 1 each 0   . famotidine (PEPCID) 20 MG tablet Take 20 mg by mouth 2 (two) times daily.     Marland Kitchen glucose blood (ACCU-CHEK GUIDE) test strip Use to check blood  sugars four times a day was instructed 50 each 12   . Prenatal Vit-Fe Fumarate-FA (PREPLUS) 27-1 MG TABS Take 1 tablet by mouth daily. 30 tablet 13     Review of Systems  Gastrointestinal: Negative for abdominal pain.  Genitourinary: Positive for vaginal bleeding.   Physical Exam   Blood pressure 113/72, pulse (!) 105, temperature 98.6 F (37 C), temperature source Oral, resp. rate 17, height 5' 4.5" (1.638 m), weight 86.3 kg, last menstrual period 04/06/2019, SpO2 99 %, unknown if currently breastfeeding.  Physical Exam  Nursing note and vitals reviewed. Constitutional: She is oriented to person, place, and time. She appears well-developed. No distress.  HENT:  Head: Normocephalic and atraumatic.  Respiratory: Effort normal. No respiratory distress.  GI: Soft. There is no abdominal tenderness.  gravid  Genitourinary:    Genitourinary Comments: External: no lesions or erythema Vagina: rugated, pink, moist, scant brown bloody mucus at os Cervix 1/thick/vtx    Neurological: She is alert and oriented to person, place, and time.  Skin: Skin is  warm and dry.  Psychiatric: Mood normal.  EFM: 165 bpm, mod variability, + accels, no decels Toco: irritability, rare ctx  Results for orders placed or performed during the hospital encounter of 12/11/19 (from the past 24 hour(s))  Urinalysis, Routine w reflex microscopic     Status: Abnormal   Collection Time: 12/11/19  2:05 AM  Result Value Ref Range   Color, Urine STRAW (A) YELLOW   APPearance CLEAR CLEAR   Specific Gravity, Urine 1.004 (L) 1.005 - 1.030   pH 7.0 5.0 - 8.0   Glucose, UA NEGATIVE NEGATIVE mg/dL   Hgb urine dipstick NEGATIVE NEGATIVE   Bilirubin Urine NEGATIVE NEGATIVE   Ketones, ur NEGATIVE NEGATIVE mg/dL   Protein, ur NEGATIVE NEGATIVE mg/dL   Nitrite NEGATIVE NEGATIVE   Leukocytes,Ua NEGATIVE NEGATIVE   MAU Course  Procedures  MDM No evidence of PTL or abruption. Spotting likely from previous cervical exam.  Pt reassured. FHR initially tachy but after po hydration, and laterally repositioning FHR 135-140 and reactive. Stable for discharge home.  Assessment and Plan   1. [redacted] weeks gestation of pregnancy   2. NST (non-stress test) reactive   3. Spotting affecting pregnancy in third trimester    Discharge home Follow up at [facility] as scheduled PTL precautions Bleeding precautions  Allergies as of 12/11/2019   No Known Allergies     Medication List    TAKE these medications   Accu-Chek Guide test strip Generic drug: glucose blood Use to check blood sugars four times a day was instructed   Accu-Chek Guide w/Device Kit 1 Device by Does not apply route 4 (four) times daily.   Accu-Chek Softclix Lancets lancets 1 each by Other route 4 (four) times daily.   Blood Pressure Kit Devi 1 Device by Does not apply route as needed.   Comfort Fit Maternity Supp Sm Misc 1 Units by Does not apply route daily as needed.   famotidine 20 MG tablet Commonly known as: PEPCID Take 20 mg by mouth 2 (two) times daily.   PrePLUS 27-1 MG Tabs Take 1 tablet by mouth daily.      Video interpreter present for all interactions  Julianne Handler, CNM 12/11/2019, 2:39 PM

## 2019-12-11 NOTE — Discharge Instructions (Signed)
Vaginal Bleeding During Pregnancy, Third Trimester  A small amount of bleeding from the vagina (spotting) is relatively common during pregnancy. Various things can cause bleeding or spotting during pregnancy. Sometimes bleeding is normal and is not a problem. However, bleeding during the third trimester can also be a sign of something serious for the mother and the baby. Be sure to tell your health care provider about any vaginal bleeding right away. Some possible causes of vaginal bleeding during the third trimester include:  Infection or growths (polyps) on the cervix.  A condition in which the placenta partially or completely covers the opening of the cervix inside the uterus (placenta previa).  The placenta separating from the uterus (placenta abruption).  The start of labor (discharging of the mucus plug).  A condition in which the placenta grows into the muscle layer of the uterus (placenta accreta). Follow these instructions at home: Activity  Follow instructions from your health care provider about limiting your activity. If your health care provider recommends activity restriction, you may need to stay in bed and only get up to use the bathroom. In some cases, your health care provider may allow you to continue light activity.  If needed, make plans for someone to help with your regular activities.  Ask your health care provider if it is safe for you to drive.  Do not lift anything that is heavier than 10 lb (4.5 kg), or the limit that your health care provider tells you, until he or she says that it is safe.  Do not have sex or orgasms until your health care provider says that this is safe. Medicines  Take over-the-counter and prescription medicines only as told by your health care provider.  Do not take aspirin because it can cause bleeding. General instructions  Pay attention to any changes in your symptoms.  Write down how many pads you use each day, how often you  change pads, and how soaked (saturated) they are.  Do not use tampons or douche.  If you pass any tissue from your vagina, save the tissue so you can show it to your health care provider.  Keep all follow-up visits as told by your health care provider. This is important. Contact a health care provider if:  You have vaginal bleeding during any part of your pregnancy.  You have cramps or labor pains.  You have a fever. Get help right away if:  You have severe cramps or pain in your back or abdomen.  You have a gush of fluid from the vagina.  You pass large clots or a large amount of tissue from your vagina.  Your bleeding increases.  You feel light-headed or weak.  You faint.  You feel that your baby is moving less than usual, or not moving at all. Summary  Various things can cause bleeding or spotting in pregnancy.  Bleeding during the third trimester can be a sign of a serious problem for the mother and the baby.  Be sure to tell your health care provider about any vaginal bleeding right away. This information is not intended to replace advice given to you by your health care provider. Make sure you discuss any questions you have with your health care provider. Document Revised: 09/23/2018 Document Reviewed: 09/06/2016 Elsevier Patient Education  2020 Elsevier Inc.  

## 2019-12-11 NOTE — MAU Note (Signed)
Pt discharge home in good condition.  Hope with language services interpreted AVS instructions and patient verbalized understanding.

## 2019-12-11 NOTE — MAU Note (Signed)
Noted bleeding when wiping, red, few small clots.  First noted at 1030.  Some pain in lower abd, was here during the night because of pain.Marland Kitchen

## 2019-12-11 NOTE — Discharge Instructions (Signed)
Reasons to return to MAU at [facility]:  1.  Contractions are  5 minutes apart or less, each last 1 minute, these have been going on for 1-2 hours, and you cannot walk or talk during them 2.  You have a large gush of fluid, or a trickle of fluid that will not stop and you have to wear a pad 3.  You have bleeding that is bright red, heavier than spotting--like menstrual bleeding (spotting can be normal in early labor or after a check of your cervix) 4.  You do not feel the baby moving like he/she normally does  ?? MAU ? [place] ??????????  1.???????5???????1?????1-2???????????? 2. ??????????????????????????? 3. ????????????????----??????????????????????????????? 4. ??????????????  Hu do MAU de [place] fn? rtng zh?ngx?n de yuny?n:  1. G?ng su? jing b ch?ogu 5 f?nzh?ng, m?i c chx 1 f?nzh?ng, chx 1-2 xi?osh, qji?n bnng z?ul hu shu?hu 2. Nn y?u dling yt? y?ngch?, hu d?xi wf? tngzh? de yt?, nn bx? di shng h din 3. N? y?u xi?nhng s de ch?xi?, b? di?nd? ch?xi? gng zhng----ji xing yuj?ng ch?xi? y?yng (zi f?nmi?n z?oq hu ji?nch z? g?ngj?ng hu, di?nd? k?nng sh zhngchng de) 4. N? g?nju b do b?ob?o??xing w?ngchng y?yng zi dng

## 2019-12-11 NOTE — MAU Provider Note (Signed)
Chief Complaint:  Abdominal Pain   First Provider Initiated Contact with Patient 12/11/19 [phone]      HPI: Stephanie Wolfe is a 34 y.o. [id number] at 37w4dby LMP who presents to maternity admissions reporting onset of abdominal pain 5-6 times per hour at noon today.  The pain is improved with less frequent cramping/pain in MAU. She reports good fetal movement.  Video interpreter with Mandarin language used for all communication.   Location: lower abdomen Quality: cramping Severity: 7/10 on pain scale Duration: 15 hours Timing: intermittent Modifying factors: drinking water, rest with some improvement Associated signs and symptoms: none  HPI  Past Medical History: Past Medical History:  Diagnosis Date  . Medical history non-contributory     Past obstetric history: OB History  Gravida Para Term Preterm AB Living  '5 4 4     4  '$ SAB TAB Ectopic Multiple Live Births          4    # Outcome Date GA Lbr Len/2nd Weight Sex Delivery Anes PTL Lv  5 Current           4 Term 11/29/16    F CS-LTranv   LIV     Complications: Breech presentation  3 Term 12/29/15    F VBAC   LIV  2 Term 01/18/11    F VBAC   LIV  1 Term 02/12/08    F CS-LTranv   LIV     Complications: Failure to Progress in First Stage    Past Surgical History: Past Surgical History:  Procedure Laterality Date  . CESAREAN SECTION      Family History: Family History  Problem Relation Age of Onset  . Cancer Mother   . Hypertension Father   . Cancer Maternal Grandfather   . Cancer Paternal Grandmother     Social History: Social History   Tobacco Use  . Smoking status: Never Smoker  . Smokeless tobacco: Never Used  Vaping Use  . Vaping Use: Never used  Substance Use Topics  . Alcohol use: Not Currently  . Drug use: Never    Allergies: No Known Allergies  Meds:  Medications Prior to Admission  Medication Sig Dispense Refill Last Dose  . Accu-Chek Softclix Lancets lancets 1 each by Other route 4 (four)  times daily. 100 each 12 12/10/2019 at Unknown time  . Blood Glucose Monitoring Suppl (ACCU-CHEK GUIDE) w/Device KIT 1 Device by Does not apply route 4 (four) times daily. 1 kit 0 12/10/2019 at Unknown time  . Blood Pressure Monitoring (BLOOD PRESSURE KIT) DEVI 1 Device by Does not apply route as needed. 1 each 0 12/10/2019 at Unknown time  . Elastic Bandages & Supports (COMFORT FIT MATERNITY SUPP SM) MISC 1 Units by Does not apply route daily as needed. 1 each 0 12/10/2019 at Unknown time  . glucose blood (ACCU-CHEK GUIDE) test strip Use to check blood sugars four times a day was instructed 50 each 12 12/10/2019 at Unknown time  . Prenatal Vit-Fe Fumarate-FA (PREPLUS) 27-1 MG TABS Take 1 tablet by mouth daily. 30 tablet 13 12/10/2019 at Unknown time  . famotidine (PEPCID) 20 MG tablet Take 20 mg by mouth 2 (two) times daily.       ROS:  Review of Systems  Constitutional: Negative for chills, fatigue and fever.  Eyes: Negative for visual disturbance.  Respiratory: Negative for shortness of breath.   Cardiovascular: Negative for chest pain.  Gastrointestinal: Positive for abdominal pain. Negative for nausea and vomiting.  Genitourinary:  Negative for difficulty urinating, dysuria, flank pain, pelvic pain, vaginal bleeding, vaginal discharge and vaginal pain.  Neurological: Negative for dizziness and headaches.  Psychiatric/Behavioral: Negative.      I have reviewed patient's Past Medical Hx, Surgical Hx, Family Hx, Social Hx, medications and allergies.   Physical Exam   Patient Vitals for the past 24 hrs:  BP Temp Temp src Pulse Resp  12/11/19 0353 113/65 -- -- 87 --  12/11/19 0203 111/68 97.9 F (36.6 C) Oral 92 16   Constitutional: Well-developed, well-nourished female in no acute distress.  Cardiovascular: normal rate Respiratory: normal effort GI: Abd soft, non-tender, gravid appropriate for gestational age.  MS: Extremities nontender, no edema, normal ROM Neurologic: Alert and  oriented x 4.  GU: Neg CVAT.    Dilation: Fingertip Effacement (%): Thick Exam by:: Fatima Blank, CNM  FHT:  Baseline 140 , moderate variability, accelerations present, no decelerations Contractions: q 3-11 mins, mild to palpation   Labs: Results for orders placed or performed during the hospital encounter of 12/11/19 (from the past 24 hour(s))  Urinalysis, Routine w reflex microscopic     Status: Abnormal   Collection Time: 12/11/19  2:05 AM  Result Value Ref Range   Color, Urine STRAW (A) YELLOW   APPearance CLEAR CLEAR   Specific Gravity, Urine 1.004 (L) 1.005 - 1.030   pH 7.0 5.0 - 8.0   Glucose, UA NEGATIVE NEGATIVE mg/dL   Hgb urine dipstick NEGATIVE NEGATIVE   Bilirubin Urine NEGATIVE NEGATIVE   Ketones, ur NEGATIVE NEGATIVE mg/dL   Protein, ur NEGATIVE NEGATIVE mg/dL   Nitrite NEGATIVE NEGATIVE   Leukocytes,Ua NEGATIVE NEGATIVE   B/Positive/-- (01/08 1154)  Imaging:  No results found.  MAU Course/MDM: Orders Placed This Encounter  Procedures  . Urinalysis, Routine w reflex microscopic  . Discharge patient    No orders of the defined types were placed in this encounter.    NST reviewed and reactive No evidence of preterm labor with cervix FT/long/posterior Offered therapeutic rest for pt pain, pt declined D/C home with preterm labor precautions Increase PO fluids Return to MAU as needed for signs of labor or emergencies Keep scheduled appt in office   Assessment: 1. Threatened preterm labor, third trimester   2. Braxton Hicks contractions     Plan: Discharge home Labor precautions and fetal kick counts  Follow-up Information    [facility] Follow up.   Why: As scheduled, return to MAU as needed for signs of labor or emergencies Contact information: [place] [id number] [phone]             Allergies as of 12/11/2019   No Known Allergies     Medication List    TAKE  these medications   Accu-Chek Guide test strip Generic drug: glucose blood Use to check blood sugars four times a day was instructed   Accu-Chek Guide w/Device Kit 1 Device by Does not apply route 4 (four) times daily.   Accu-Chek Softclix Lancets lancets 1 each by Other route 4 (four) times daily.   Blood Pressure Kit Devi 1 Device by Does not apply route as needed.   Comfort Fit Maternity Supp Sm Misc 1 Units by Does not apply route daily as needed.   famotidine 20 MG tablet Commonly known as: PEPCID Take 20 mg by mouth 2 (two) times daily.   PrePLUS 27-1 MG Tabs Take 1 tablet by mouth daily.       Lattie Haw  [place] Certified Nurse-Midwife 12/11/2019 4:03 AM

## 2019-12-14 ENCOUNTER — Other Ambulatory Visit: Payer: Self-pay

## 2019-12-14 ENCOUNTER — Ambulatory Visit: Payer: Medicaid Other | Admitting: *Deleted

## 2019-12-14 ENCOUNTER — Ambulatory Visit: Payer: Medicaid Other | Attending: Obstetrics and Gynecology

## 2019-12-14 ENCOUNTER — Encounter: Payer: Self-pay | Admitting: *Deleted

## 2019-12-14 DIAGNOSIS — Z3A36 36 weeks gestation of pregnancy: Secondary | ICD-10-CM

## 2019-12-14 DIAGNOSIS — O24410 Gestational diabetes mellitus in pregnancy, diet controlled: Secondary | ICD-10-CM | POA: Diagnosis not present

## 2019-12-14 DIAGNOSIS — O34219 Maternal care for unspecified type scar from previous cesarean delivery: Secondary | ICD-10-CM | POA: Diagnosis not present

## 2019-12-15 ENCOUNTER — Other Ambulatory Visit (HOSPITAL_COMMUNITY)
Admission: RE | Admit: 2019-12-15 | Discharge: 2019-12-15 | Disposition: A | Discharge status: Home / Self Care | Payer: Medicaid Other | Source: Ambulatory Visit | Attending: Obstetrics and Gynecology | Admitting: Obstetrics and Gynecology

## 2019-12-15 ENCOUNTER — Ambulatory Visit (INDEPENDENT_AMBULATORY_CARE_PROVIDER_SITE_OTHER): Payer: Medicaid Other | Admitting: Obstetrics and Gynecology

## 2019-12-15 VITALS — BP 110/73 | HR 106 | Wt 190.0 lb

## 2019-12-15 DIAGNOSIS — Z789 Other specified health status: Secondary | ICD-10-CM

## 2019-12-15 DIAGNOSIS — Z8759 Personal history of other complications of pregnancy, childbirth and the puerperium: Secondary | ICD-10-CM

## 2019-12-15 DIAGNOSIS — Z3480 Encounter for supervision of other normal pregnancy, unspecified trimester: Secondary | ICD-10-CM | POA: Insufficient documentation

## 2019-12-15 DIAGNOSIS — O34219 Maternal care for unspecified type scar from previous cesarean delivery: Secondary | ICD-10-CM

## 2019-12-15 DIAGNOSIS — O24410 Gestational diabetes mellitus in pregnancy, diet controlled: Secondary | ICD-10-CM

## 2019-12-15 DIAGNOSIS — O09293 Supervision of pregnancy with other poor reproductive or obstetric history, third trimester: Secondary | ICD-10-CM

## 2019-12-15 DIAGNOSIS — Z3A36 36 weeks gestation of pregnancy: Secondary | ICD-10-CM

## 2019-12-15 NOTE — Progress Notes (Signed)
ROB/GBS.  C/o contractions 1 hour apart and bleeding last Friday.

## 2019-12-15 NOTE — Progress Notes (Signed)
° °  PRENATAL VISIT NOTE  Subjective:  Stephanie Wolfe is a 34 y.o. G5P4004 at [redacted]w[redacted]d being seen today for ongoing prenatal care.  She is currently monitored for the following issues for this high-risk pregnancy and has Supervision of other normal pregnancy, antepartum; Previous cesarean section complicating pregnancy, antepartum condition or complication; History of amniotic fluid embolism; Group B streptococcal bacteriuria; Traumatic injury during pregnancy in second trimester; Language barrier; Abnormal glucose tolerance test (GTT) during pregnancy, antepartum; and Diet controlled gestational diabetes mellitus (GDM) in third trimester on their problem list.  Patient doing well with no acute concerns today. She reports vaginal bleeding on Friday equivalent to spotting, which stopped by Saturday.  No bleeding since then.  Pt denies recent intercourse.   Contractions: Irregular. Vag. Bleeding: None.  Movement: Present. Denies leaking of fluid.   The following portions of the patient's history were reviewed and updated as appropriate: allergies, current medications, past family history, past medical history, past social history, past surgical history and problem list. Problem list updated.  Objective:   Vitals:   12/15/19 1312  BP: 110/73  Pulse: (!) 106  Weight: 190 lb (86.2 kg)    Fetal Status: Fetal Heart Rate (bpm): 158 Fundal Height: 37 cm Movement: Present     General:  Alert, oriented and cooperative. Patient is in no acute distress.  Skin: Skin is warm and dry. No rash noted.   Cardiovascular: Normal heart rate noted  Respiratory: Normal respiratory effort, no problems with respiration noted  Abdomen: Soft, gravid, appropriate for gestational age.  Pain/Pressure: Present     Pelvic: Cervical exam performed Dilation: Fingertip Effacement (%): 60 Station: -3  Extremities: Normal range of motion.  Edema: Trace  Mental Status:  Normal mood and affect. Normal behavior. Normal judgment and  thought content.    SSE: cvx WNL, no active bleeding and no old blood noted Assessment and Plan:  Pregnancy: G5P4004 at [redacted]w[redacted]d  1. History of amniotic fluid embolism   2. Diet controlled gestational diabetes mellitus (GDM) in third trimester Pt did not bring in blood sugar results, verbal results were in range  3. Supervision of other normal pregnancy, antepartum  - Cervicovaginal ancillary only( [place])  4. Previous cesarean section complicating pregnancy, antepartum condition or complication Schedule repeat c/s at next visit, pt undecided for IUD placement versus nexplanon  5. Language barrier Interpreter present  Term labor symptoms and general obstetric precautions including but not limited to vaginal bleeding, contractions, leaking of fluid and fetal movement were reviewed in detail with the patient.  Please refer to After Visit Summary for other counseling recommendations.   Return in about 1 week (around 12/22/2019) for ROB, in person.   Mariel Aloe, MD

## 2019-12-16 LAB — CERVICOVAGINAL ANCILLARY ONLY
Bacterial Vaginitis (gardnerella): NEGATIVE
Chlamydia: NEGATIVE
Comment: NEGATIVE
Comment: NEGATIVE
Comment: NORMAL
Neisseria Gonorrhea: NEGATIVE

## 2019-12-22 ENCOUNTER — Other Ambulatory Visit: Payer: Self-pay

## 2019-12-22 ENCOUNTER — Encounter: Payer: Self-pay | Admitting: Family Medicine

## 2019-12-22 ENCOUNTER — Ambulatory Visit (INDEPENDENT_AMBULATORY_CARE_PROVIDER_SITE_OTHER): Payer: Medicaid Other | Admitting: Family Medicine

## 2019-12-22 VITALS — BP 106/74 | HR 100 | Wt 189.0 lb

## 2019-12-22 DIAGNOSIS — O34219 Maternal care for unspecified type scar from previous cesarean delivery: Secondary | ICD-10-CM

## 2019-12-22 DIAGNOSIS — O24410 Gestational diabetes mellitus in pregnancy, diet controlled: Secondary | ICD-10-CM

## 2019-12-22 DIAGNOSIS — Z8759 Personal history of other complications of pregnancy, childbirth and the puerperium: Secondary | ICD-10-CM

## 2019-12-22 DIAGNOSIS — Z603 Acculturation difficulty: Secondary | ICD-10-CM

## 2019-12-22 DIAGNOSIS — Z3A37 37 weeks gestation of pregnancy: Secondary | ICD-10-CM

## 2019-12-22 DIAGNOSIS — O99820 Streptococcus B carrier state complicating pregnancy: Secondary | ICD-10-CM

## 2019-12-22 DIAGNOSIS — Z789 Other specified health status: Secondary | ICD-10-CM

## 2019-12-22 DIAGNOSIS — Z3480 Encounter for supervision of other normal pregnancy, unspecified trimester: Secondary | ICD-10-CM

## 2019-12-22 DIAGNOSIS — Z98891 History of uterine scar from previous surgery: Secondary | ICD-10-CM

## 2019-12-22 DIAGNOSIS — R8271 Bacteriuria: Secondary | ICD-10-CM

## 2019-12-22 MED ORDER — ACCU-CHEK GUIDE VI STRP: ORAL_STRIP | 12 refills | Status: DC

## 2019-12-22 MED ORDER — ACCU-CHEK SOFTCLIX LANCETS MISC: 1.0000 | Freq: Four times a day (QID) | 12 refills | Status: DC

## 2019-12-22 NOTE — Progress Notes (Signed)
Subjective:  Azya Stammen is a 34 y.o. G5P4004 at [redacted]w[redacted]d being seen today for ongoing prenatal care.  She is currently monitored for the following issues for this high-risk pregnancy and has Supervision of other normal pregnancy, antepartum; Previous cesarean section complicating pregnancy, antepartum condition or complication; History of amniotic fluid embolism; Group B streptococcal bacteriuria; Traumatic injury during pregnancy in second trimester; Language barrier; and Diet controlled gestational diabetes mellitus (GDM) in third trimester on their problem list.  Patient reports occasional discomfort when on her sides.  Contractions: Irritability. Vag. Bleeding: None.  Movement: Present. Denies leaking of fluid.   The following portions of the patient's history were reviewed and updated as appropriate: allergies, current medications, past family history, past medical history, past social history, past surgical history and problem list. Problem list updated.  Objective:   Vitals:   12/22/19 1559  BP: 106/74  Pulse: 100  Weight: 189 lb (85.7 kg)    Fetal Status: Fetal Heart Rate (bpm): 154 Fundal Height: 38 cm Movement: Present     General:  Alert, oriented and cooperative. Patient is in no acute distress.  Skin: Skin is warm and dry. No rash noted.   Cardiovascular: Normal heart rate noted  Respiratory: Normal respiratory effort, no problems with respiration noted  Abdomen: Soft, gravid, appropriate for gestational age. Pain/Pressure: Present     Pelvic: Vag. Bleeding: None     Cervical exam deferred        Extremities: Normal range of motion.  Edema: None  Mental Status: Normal mood and affect. Normal behavior. Normal judgment and thought content.   Urinalysis:      Assessment and Plan:  Pregnancy: G5P4004 at [redacted]w[redacted]d  1. Diet controlled gestational diabetes mellitus (GDM), antepartum - Well controlled with diet - Accu-Chek Softclix Lancets lancets; 1 each by Other route 4 (four)  times daily.  Dispense: 100 each; Refill: 12 - glucose blood (ACCU-CHEK GUIDE) test strip; Use to check blood sugars four times a day was instructed  Dispense: 50 each; Refill: 12  2. Supervision of other normal pregnancy, antepartum - Continue routine prenatal care - would like postplacental IUD placed at time of CS  3. Previous cesarean section complicating pregnancy, antepartum condition or complication - repeat CS scheduled for 39 weeks, 01/04/20  4. Group B streptococcal bacteriuria  5. Language barrier - Mandarin interpreting services used for this encounter  6. History of amniotic fluid embolism - ? PE based on patient's description, records still not available from [place]  Term labor symptoms and general obstetric precautions including but not limited to vaginal bleeding, contractions, leaking of fluid and fetal movement were reviewed in detail with the patient. Please refer to After Visit Summary for other counseling recommendations.  Return in about 1 week (around 12/29/2019) for HROB, in person.   Ly Bacchi L, DO

## 2019-12-22 NOTE — Patient Instructions (Signed)
Breastfeeding and Self-Care It is normal to have some problems when you start to breastfeed your new baby. But there are things that you can do to take care of yourself and help prevent many common problems. This includes keeping your breasts healthy and making sure that your baby's mouth attaches (latches) properly to your nipple for feedings. Work with your doctor or breastfeeding specialist (lactation consultant) to find what works best for you. Follow these instructions at home: Breastfeeding strategy   Always make sure that your baby latches properly to breastfeed.  Make sure that your baby is in a proper position. Try different breastfeeding positions to find one that works best for you and your baby.  Breastfeed when you feel like you need to make your breasts less full or when your baby shows signs of hunger. This is called "breastfeeding on demand."  Do not delay feedings.  Try to relax when it is time to feed your baby. This helps your body release milk from your breast.  To help increase milk flow: ? Remove a small amount of milk from your breast right before breastfeeding. Do this using a pump or by squeezing with your hand. ? Apply warm, moist heat to your breast right before feeding. You can do this in the shower or with hand towels soaked with warm water. ? Massage your breast right before or during feeding. Breast care   To help your breasts stay healthy and keep them from getting too dry: ? Avoid using soap on your nipples. ? Let your nipples air-dry for 3-4 minutes after each feeding. ? Use only cotton bra pads to soak up breast milk that leaks. Be sure to change the pads if they become soaked with milk. If you use bra pads that can be thrown away, change them often. ? Put some lanolin on your nipples after breastfeeding. Pure lanolin does not need to be washed off your nipple before you feed your baby again. Pure lanolin is not harmful to your baby. ? Rub some breast  milk into your nipples:  Use your hand to squeeze out a few drops of breast milk.  Gently massage the milk into your nipples.  Let your nipples air-dry.  Wear a supportive nursing bra. Avoid wearing: ? Tight clothing. ? Underwire bras or bras that put pressure on your breasts.  Use ice to help relieve pain or swelling of your breasts: ? Put ice in a plastic bag. ? Place a towel between your skin and the bag. ? Leave the ice on for 20 minutes, 2-3 times a day. General instructions  Drink enough fluid to keep your pee (urine) pale yellow.  Get plenty of rest. Sleep when your baby sleeps.  Talk to your doctor or breastfeeding specialist before taking any herbal supplements. Contact a health care provider if:  You have nipple pain.  You have cracking or soreness in your nipples that lasts longer than 1 week.  Your breasts are overfilled with milk (engorgement) and this lasts longer than 48 hours.  You have a fever.  You have pus-like fluid coming from your nipple.  You have redness, a rash, swelling, itching, or burning on your breast.  Your baby does not gain weight.  Your baby loses weight. Summary  There are things that you can do to take care of yourself and help prevent many common breastfeeding problems.  Always make sure that your baby's mouth attaches (latches) to your nipple properly to breastfeed.  Keep your nipples   from getting too dry, drink plenty of fluid, and get plenty of rest.  Feed on demand. Do not delay feedings. This information is not intended to replace advice given to you by your health care provider. Make sure you discuss any questions you have with your health care provider. Document Revised: 09/24/2018 Document Reviewed: 01/09/2017 Elsevier Patient Education  2020 Elsevier Inc.  

## 2019-12-22 NOTE — Progress Notes (Signed)
ROB   CC: pt wants to discuss contraception that was discussed last visit .  Pt has been checking blood sugars pt states blood sugar was a little over 100 after eating.  Pt states when she moves on her sides she notes pain.

## 2019-12-25 ENCOUNTER — Encounter (HOSPITAL_COMMUNITY): Payer: Self-pay

## 2019-12-25 NOTE — Pre-Procedure Instructions (Signed)
Interpreter number [phone]

## 2019-12-25 NOTE — Patient Instructions (Signed)
Stephanie Wolfe  12/25/2019   Your procedure is scheduled on:  01/04/2020  Arrive at 0745 at Entrance C on [address] at [facility]  and [address]. You are invited to use the FREE valet parking or use the Visitor's parking deck.  Pick up the phone at the desk and dial [phone].  Call this number if you have problems the morning of surgery: [phone]  Remember:   Do not eat food:(After Midnight) Desps de medianoche.  Do not drink clear liquids: (After Midnight) Desps de medianoche.  Take these medicines the morning of surgery with A SIP OF WATER:  none   Do not wear jewelry, make-up or nail polish.  Do not wear lotions, powders, or perfumes. Do not wear deodorant.  Do not shave 48 hours prior to surgery.  Do not bring valuables to the hospital.  [facility] is not   responsible for any belongings or valuables brought to the hospital.  Contacts, dentures or bridgework may not be worn into surgery.  Leave suitcase in the car. After surgery it may be brought to your room.  For patients admitted to the hospital, checkout time is 11:00 AM the day of              discharge.      Please read over the following fact sheets that you were given:     Preparing for Surgery

## 2019-12-26 ENCOUNTER — Other Ambulatory Visit: Payer: Self-pay

## 2019-12-26 ENCOUNTER — Inpatient Hospital Stay (HOSPITAL_COMMUNITY): Payer: Medicaid Other | Admitting: Anesthesiology

## 2019-12-26 ENCOUNTER — Encounter (HOSPITAL_COMMUNITY)
Admission: AD | Disposition: A | Discharge status: Home / Self Care | Payer: Self-pay | Source: Home / Self Care | Attending: Obstetrics & Gynecology

## 2019-12-26 ENCOUNTER — Inpatient Hospital Stay (HOSPITAL_COMMUNITY)
Admission: AD | Admit: 2019-12-26 | Discharge: 2019-12-28 | DRG: 788 | Disposition: A | Discharge status: Home / Self Care | Payer: Medicaid Other | Attending: Obstetrics & Gynecology | Admitting: Obstetrics & Gynecology

## 2019-12-26 ENCOUNTER — Encounter (HOSPITAL_COMMUNITY): Payer: Self-pay | Admitting: Obstetrics & Gynecology

## 2019-12-26 DIAGNOSIS — Z20822 Contact with and (suspected) exposure to covid-19: Secondary | ICD-10-CM | POA: Diagnosis present

## 2019-12-26 DIAGNOSIS — Z3A37 37 weeks gestation of pregnancy: Secondary | ICD-10-CM

## 2019-12-26 DIAGNOSIS — O24420 Gestational diabetes mellitus in childbirth, diet controlled: Secondary | ICD-10-CM | POA: Diagnosis present

## 2019-12-26 DIAGNOSIS — O24410 Gestational diabetes mellitus in pregnancy, diet controlled: Secondary | ICD-10-CM | POA: Diagnosis present

## 2019-12-26 DIAGNOSIS — Z30430 Encounter for insertion of intrauterine contraceptive device: Secondary | ICD-10-CM

## 2019-12-26 DIAGNOSIS — O321XX0 Maternal care for breech presentation, not applicable or unspecified: Secondary | ICD-10-CM | POA: Diagnosis present

## 2019-12-26 DIAGNOSIS — O99824 Streptococcus B carrier state complicating childbirth: Secondary | ICD-10-CM

## 2019-12-26 DIAGNOSIS — O34211 Maternal care for low transverse scar from previous cesarean delivery: Secondary | ICD-10-CM | POA: Diagnosis present

## 2019-12-26 DIAGNOSIS — O26893 Other specified pregnancy related conditions, third trimester: Secondary | ICD-10-CM | POA: Diagnosis present

## 2019-12-26 DIAGNOSIS — Z789 Other specified health status: Secondary | ICD-10-CM | POA: Diagnosis present

## 2019-12-26 DIAGNOSIS — O471 False labor at or after 37 completed weeks of gestation: Secondary | ICD-10-CM

## 2019-12-26 DIAGNOSIS — Z8759 Personal history of other complications of pregnancy, childbirth and the puerperium: Secondary | ICD-10-CM

## 2019-12-26 DIAGNOSIS — O34219 Maternal care for unspecified type scar from previous cesarean delivery: Secondary | ICD-10-CM | POA: Diagnosis present

## 2019-12-26 DIAGNOSIS — R8271 Bacteriuria: Secondary | ICD-10-CM | POA: Diagnosis present

## 2019-12-26 LAB — CBC
HCT: 40.8 % (ref 36.0–46.0)
Hemoglobin: 13.3 g/dL (ref 12.0–15.0)
MCH: 30.5 pg (ref 26.0–34.0)
MCHC: 32.6 g/dL (ref 30.0–36.0)
MCV: 93.6 fL (ref 80.0–100.0)
Platelets: 159 10*3/uL (ref 150–400)
RBC: 4.36 MIL/uL (ref 3.87–5.11)
RDW: 14.1 % (ref 11.5–15.5)
WBC: 8.3 10*3/uL (ref 4.0–10.5)
nRBC: 0 % (ref 0.0–0.2)

## 2019-12-26 LAB — TYPE AND SCREEN
ABO/RH(D): B POS
Antibody Screen: NEGATIVE

## 2019-12-26 LAB — SARS CORONAVIRUS 2 BY RT PCR (HOSPITAL ORDER, PERFORMED IN ~~LOC~~ HOSPITAL LAB): SARS Coronavirus 2: NEGATIVE

## 2019-12-26 LAB — ABO/RH: ABO/RH(D): B POS

## 2019-12-26 LAB — GLUCOSE, CAPILLARY
Glucose-Capillary: 71 mg/dL (ref 70–99)
Glucose-Capillary: 70 mg/dL (ref 70–99)

## 2019-12-26 SURGERY — Surgical Case
Anesthesia: Spinal

## 2019-12-26 MED ORDER — VITAMIN K1 1 MG/0.5ML IJ SOLN
INTRAMUSCULAR | Status: AC
  Filled 2019-12-26: qty 0.5

## 2019-12-26 MED ORDER — OXYTOCIN-SODIUM CHLORIDE 30-0.9 UT/500ML-% IV SOLN
INTRAVENOUS | Status: DC | PRN
  Administered 2019-12-26: 30 [IU] via INTRAVENOUS

## 2019-12-26 MED ORDER — FAMOTIDINE IN NACL 20-0.9 MG/50ML-% IV SOLN
20.0000 mg | Freq: Once | INTRAVENOUS | Status: AC
  Administered 2019-12-26: 20 mg via INTRAVENOUS
  Filled 2019-12-26: qty 50

## 2019-12-26 MED ORDER — CEFAZOLIN SODIUM-DEXTROSE 2-4 GM/100ML-% IV SOLN: 2.0000 g | INTRAVENOUS | Status: DC

## 2019-12-26 MED ORDER — LACTATED RINGERS IV BOLUS
1000.0000 mL | Freq: Once | INTRAVENOUS | Status: AC
  Administered 2019-12-26: 1000 mL via INTRAVENOUS

## 2019-12-26 MED ORDER — ACETAMINOPHEN 10 MG/ML IV SOLN
1000.0000 mg | Freq: Once | INTRAVENOUS | Status: DC | PRN
  Administered 2019-12-26: 1000 mg via INTRAVENOUS

## 2019-12-26 MED ORDER — LEVONORGESTREL 19.5 MCG/DAY IU IUD
INTRAUTERINE_SYSTEM | Freq: Once | INTRAUTERINE | Status: AC
  Administered 2019-12-26: 1 via INTRAUTERINE

## 2019-12-26 MED ORDER — LACTATED RINGERS IV SOLN: INTRAVENOUS | Status: DC | PRN

## 2019-12-26 MED ORDER — SODIUM CHLORIDE 0.9 % IR SOLN
Status: DC | PRN
  Administered 2019-12-26: 1

## 2019-12-26 MED ORDER — BUPIVACAINE IN DEXTROSE 0.75-8.25 % IT SOLN
INTRATHECAL | Status: DC | PRN
  Administered 2019-12-26: 1.6 mL via INTRATHECAL

## 2019-12-26 MED ORDER — LACTATED RINGERS IV SOLN: INTRAVENOUS | Status: DC

## 2019-12-26 MED ORDER — MORPHINE SULFATE (PF) 0.5 MG/ML IJ SOLN
INTRAMUSCULAR | Status: AC
  Filled 2019-12-26: qty 10

## 2019-12-26 MED ORDER — TRANEXAMIC ACID-NACL 1000-0.7 MG/100ML-% IV SOLN
1000.0000 mg | Freq: Once | INTRAVENOUS | Status: AC
  Administered 2019-12-26: 1000 mg via INTRAVENOUS

## 2019-12-26 MED ORDER — TRANEXAMIC ACID-NACL 1000-0.7 MG/100ML-% IV SOLN
INTRAVENOUS | Status: AC
  Filled 2019-12-26: qty 100

## 2019-12-26 MED ORDER — MEPERIDINE HCL 25 MG/ML IJ SOLN: 6.2500 mg | INTRAMUSCULAR | Status: DC | PRN

## 2019-12-26 MED ORDER — ONDANSETRON HCL 4 MG/2ML IJ SOLN
INTRAMUSCULAR | Status: AC
  Filled 2019-12-26: qty 2

## 2019-12-26 MED ORDER — PHENYLEPHRINE HCL-NACL 20-0.9 MG/250ML-% IV SOLN
INTRAVENOUS | Status: AC
  Filled 2019-12-26: qty 250

## 2019-12-26 MED ORDER — PHENYLEPHRINE HCL (PRESSORS) 10 MG/ML IV SOLN
INTRAVENOUS | Status: DC | PRN
  Administered 2019-12-26 (×3): 80 ug via INTRAVENOUS

## 2019-12-26 MED ORDER — KETOROLAC TROMETHAMINE 30 MG/ML IJ SOLN
INTRAMUSCULAR | Status: AC
  Filled 2019-12-26: qty 1

## 2019-12-26 MED ORDER — ONDANSETRON HCL 4 MG/2ML IJ SOLN
INTRAMUSCULAR | Status: DC | PRN
  Administered 2019-12-26: 4 mg via INTRAVENOUS

## 2019-12-26 MED ORDER — OXYTOCIN-SODIUM CHLORIDE 30-0.9 UT/500ML-% IV SOLN
INTRAVENOUS | Status: AC
  Filled 2019-12-26: qty 500

## 2019-12-26 MED ORDER — FENTANYL CITRATE (PF) 100 MCG/2ML IJ SOLN: 25.0000 ug | INTRAMUSCULAR | Status: DC | PRN

## 2019-12-26 MED ORDER — MORPHINE SULFATE (PF) 0.5 MG/ML IJ SOLN
INTRAMUSCULAR | Status: DC | PRN
  Administered 2019-12-26: .15 mg via INTRATHECAL

## 2019-12-26 MED ORDER — KETOROLAC TROMETHAMINE 30 MG/ML IJ SOLN
30.0000 mg | Freq: Once | INTRAMUSCULAR | Status: AC
  Administered 2019-12-26: 30 mg via INTRAVENOUS

## 2019-12-26 MED ORDER — FENTANYL CITRATE (PF) 100 MCG/2ML IJ SOLN
INTRAMUSCULAR | Status: DC | PRN
  Administered 2019-12-26: 15 ug via INTRATHECAL

## 2019-12-26 MED ORDER — PHENYLEPHRINE 40 MCG/ML (10ML) SYRINGE FOR IV PUSH (FOR BLOOD PRESSURE SUPPORT)
PREFILLED_SYRINGE | INTRAVENOUS | Status: AC
  Filled 2019-12-26: qty 10

## 2019-12-26 MED ORDER — SOD CITRATE-CITRIC ACID 500-334 MG/5ML PO SOLN
30.0000 mL | Freq: Once | ORAL | Status: AC
  Administered 2019-12-26: 30 mL via ORAL
  Filled 2019-12-26: qty 30

## 2019-12-26 MED ORDER — FENTANYL CITRATE (PF) 100 MCG/2ML IJ SOLN
INTRAMUSCULAR | Status: AC
  Filled 2019-12-26: qty 2

## 2019-12-26 MED ORDER — ACETAMINOPHEN 10 MG/ML IV SOLN
INTRAVENOUS | Status: AC
  Filled 2019-12-26: qty 100

## 2019-12-26 MED ORDER — PHENYLEPHRINE HCL-NACL 20-0.9 MG/250ML-% IV SOLN
INTRAVENOUS | Status: DC | PRN
  Administered 2019-12-26: 60 ug/min via INTRAVENOUS

## 2019-12-26 MED ORDER — STERILE WATER FOR IRRIGATION IR SOLN
Status: DC | PRN
  Administered 2019-12-26: 1

## 2019-12-26 MED ORDER — SODIUM CHLORIDE 0.9 % IV SOLN: INTRAVENOUS | Status: DC | PRN

## 2019-12-26 MED ORDER — LEVONORGESTREL 19.5 MCG/DAY IU IUD
INTRAUTERINE_SYSTEM | INTRAUTERINE | Status: AC
  Filled 2019-12-26: qty 1

## 2019-12-26 MED ORDER — KETOROLAC TROMETHAMINE 30 MG/ML IJ SOLN: 30.0000 mg | Freq: Once | INTRAMUSCULAR | Status: AC | PRN

## 2019-12-26 SURGICAL SUPPLY — 39 items
BENZOIN TINCTURE PRP APPL 2/3 (GAUZE/BANDAGES/DRESSINGS) ×3 IMPLANT
CHLORAPREP W/TINT 26 (MISCELLANEOUS) ×3 IMPLANT
CLAMP CORD UMBIL (MISCELLANEOUS) ×3 IMPLANT
CLOSURE STERI-STRIP 1/4X4 (GAUZE/BANDAGES/DRESSINGS) ×3 IMPLANT
CLOSURE WOUND 1/2 X4 (GAUZE/BANDAGES/DRESSINGS)
CLOTH BEACON ORANGE TIMEOUT ST (SAFETY) ×3 IMPLANT
DRSG OPSITE POSTOP 4X10 (GAUZE/BANDAGES/DRESSINGS) ×3 IMPLANT
ELECT REM PT RETURN 9FT ADLT (ELECTROSURGICAL) ×3
ELECTRODE REM PT RTRN 9FT ADLT (ELECTROSURGICAL) ×1 IMPLANT
EXTRACTOR VACUUM KIWI (MISCELLANEOUS) IMPLANT
EXTRACTOR VACUUM M CUP 4 TUBE (SUCTIONS) IMPLANT
EXTRACTOR VACUUM M CUP 4' TUBE (SUCTIONS)
GAUZE SPONGE 4X4 12PLY STRL LF (GAUZE/BANDAGES/DRESSINGS) ×6 IMPLANT
GLOVE BIO SURGEON STRL SZ7.5 (GLOVE) ×3 IMPLANT
GLOVE BIOGEL PI IND STRL 7.0 (GLOVE) ×2 IMPLANT
GLOVE BIOGEL PI INDICATOR 7.0 (GLOVE) ×4
GOWN STRL REUS W/TWL 2XL LVL3 (GOWN DISPOSABLE) ×3 IMPLANT
GOWN STRL REUS W/TWL LRG LVL3 (GOWN DISPOSABLE) ×6 IMPLANT
HEMOSTAT ARISTA ABSORB 3G PWDR (HEMOSTASIS) IMPLANT
HEMOSTAT SURGICEL 4X8 (HEMOSTASIS) ×3 IMPLANT
KIT ABG SYR 3ML LUER SLIP (SYRINGE) IMPLANT
NEEDLE HYPO 25X5/8 SAFETYGLIDE (NEEDLE) IMPLANT
NS IRRIG 1000ML POUR BTL (IV SOLUTION) ×3 IMPLANT
PACK C SECTION WH (CUSTOM PROCEDURE TRAY) ×3 IMPLANT
PAD ABD 8X10 STRL (GAUZE/BANDAGES/DRESSINGS) ×3 IMPLANT
PAD OB MATERNITY 4.3X12.25 (PERSONAL CARE ITEMS) ×3 IMPLANT
RTRCTR C-SECT PINK 25CM LRG (MISCELLANEOUS) ×3 IMPLANT
STRIP CLOSURE SKIN 1/2X4 (GAUZE/BANDAGES/DRESSINGS) IMPLANT
SUT PDS AB 0 CTX 36 PDP370T (SUTURE) IMPLANT
SUT PLAIN 0 NONE (SUTURE) IMPLANT
SUT PLAIN 2 0 (SUTURE)
SUT PLAIN ABS 2-0 CT1 27XMFL (SUTURE) IMPLANT
SUT VIC AB 0 CT1 36 (SUTURE) ×6 IMPLANT
SUT VIC AB 2-0 CT1 27 (SUTURE) ×4
SUT VIC AB 2-0 CT1 TAPERPNT 27 (SUTURE) ×2 IMPLANT
SUT VIC AB 4-0 KS 27 (SUTURE) ×3 IMPLANT
TOWEL OR 17X24 6PK STRL BLUE (TOWEL DISPOSABLE) ×6 IMPLANT
TRAY FOLEY W/BAG SLVR 14FR LF (SET/KITS/TRAYS/PACK) IMPLANT
WATER STERILE IRR 1000ML POUR (IV SOLUTION) ×3 IMPLANT

## 2019-12-26 NOTE — Op Note (Signed)
Operative Note   SURGERY DATE: 12/26/2019  PRE-OP DIAGNOSIS:  *Pregnancy at 37 weeks *Early labor *History of prior CS x 2  POST-OP DIAGNOSIS:  *Pregnancy at 37 weeks *Cesarean delivery, delivered *Breech presentation *Mild pelvic adhesive disease *IUD insertion  PROCEDURE: repeat low transverse cesarean section via pfannenstiel skin incision with double layer uterine closure, post placental IUD insertion  SURGEON: Surgeon(s) and Role:    * Williams, Gerri Lins, MD - Primary    * Dorsey Authement L, DO - OB Fellow  ASSISTANT: None  ANESTHESIA: spinal  ESTIMATED BLOOD LOSS: 591 mL  DRAINS: 200 mL UOP via indwelling foley  TOTAL IV FLUIDS: 2400 mL crystalloid  VTE PROPHYLAXIS: SCDs to bilateral lower extremities  ANTIBIOTICS: Two grams of Cefazolin were given, within 1 hour of skin incision  SPECIMENS: Placenta- to L&D  COMPLICATIONS: None immediate  INDICATIONS: History of CS x2, declines TOLAC, elective repeat, early labor  FINDINGS: Mild vesicouterine adhesions were noted. Grossly normal uterus, tubes and ovaries. Clear amniotic fluid, breech female infant, weight per medical record, APGARs 5/8/9, intact placenta.  PROCEDURE IN DETAIL: The patient was taken to the operating room where anesthesia was administered and normal fetal heart tones were confirmed. She was then prepped and draped in the normal fashion in the dorsal supine position with a leftward tilt.  After a time out was performed, a pfannensteil skin incision was made with the scalpel and carried through to the underlying layer of fascia. The fascia was then incised at the midline and this incision was extended laterally bluntly. Attention was turned to the superior aspect of the fascial incision which was grasped with the kocher clamps x 2, tented up and the rectus muscles were dissected off bluntly and sharply.  The rectus muscles were then separated in the midline and the peritoneum was entered bluntly. The  Alexis retractor was inserted and the vesicouterine peritoneum was identified.  A low transverse hysterotomy was made with the scalpel until the endometrial cavity was breached and the amniotic sac ruptured with the Allis clamp, yielding clear amniotic fluid. This incision was extended bluntly and the infant's feet were grasped, followed by the hips, shoulders and head.The cord was clamped x 2 and cut, and the infant was handed to the awaiting pediatricians, after delayed cord clamping was done for 30 seconds- due to lack of spontaneous cry.  The placenta was then gradually expressed from the uterus and then the uterus was cleared of all clots and debris. The Liletta IUD was inserted into the fundus, and the strings were guided down through the cervix using a pari of kelly clamps. The hysterotomy was repaired with a running suture of 1-0 Vicryl. A second imbricating layer of 1-0 Vicryl suture was then placed. A box suture of 1-0 Vicryl and surgicele were added to achieve excellent hemostasis.   The hysterotomy and all operative sites were reinspected and excellent hemostasis was noted.  The peritoneum was closed with a running stitch of 2-0 Vicryl. The fascia was reapproximated with 0 Vicryl in a simple running fashion bilaterally. The skin was then closed with 4-0 Vicryl, in a subcuticular fashion.  The patient  tolerated the procedure well. Sponge, lap, needle, and instrument counts were correct x 2. The patient was transferred to the recovery room awake, alert and breathing independently in stable condition.  Marlowe Alt, DO OB Fellow [facility] [employer] [facility])

## 2019-12-26 NOTE — Discharge Instructions (Signed)

## 2019-12-26 NOTE — Discharge Summary (Signed)
Postpartum Discharge Summary    Patient Name: Stephanie Wolfe DOB: [birth year] MRN: [id number]  Date of admission: 12/26/2019 Delivery date:12/26/2019  Delivering provider: Merilyn Baba  Date of discharge: 12/28/2019  Admitting diagnosis: Cesarean delivery delivered [O82] Intrauterine pregnancy: [redacted]w[redacted]d    Secondary diagnosis:  Active Problems:   Previous cesarean section complicating pregnancy, antepartum condition or complication   History of amniotic fluid embolism   Group B streptococcal bacteriuria   Language barrier   Diet controlled gestational diabetes mellitus (GDM) in third trimester   Cesarean delivery delivered   Encounter for initial insertion of intrauterine contraceptive device  Additional problems:     Discharge diagnosis: Term Pregnancy Delivered                                              Post partum procedures: post placental IUD Augmentation: N/A Complications: None  Hospital course: Sceduled C/S   34y.o. yo G5P4004 at 361w5das admitted to the hospital 12/26/2019 for scheduled cesarean section with the following indication:Elective Repeat and early labor.Delivery details are as follows:  Membrane Rupture Time/Date: 8:53 PM ,12/26/2019   Delivery Method:C-Section, Low Transverse  Details of operation can be found in separate operative note.  Patient had an uncomplicated postpartum course.  She is ambulating, tolerating a regular diet, passing flatus, and urinating well. Patient is discharged home in stable condition on  12/28/19        Newborn Data: Birth date:12/26/2019  Birth time:8:56 PM  Gender:Female  Living status:Living  Apgars:5 ,8  Weight:3910 g     Magnesium Sulfate received: No BMZ received: No Rhophylac:N/A MMR:N/A T-DaP:No Flu: N/A Transfusion:No  Physical exam  Vitals:   12/27/19 1339 12/27/19 1740 12/27/19 2121 12/28/19 0600  BP: 101/65 107/69 114/71 95/69  Pulse: 68 61 80 72  Resp: '18 18 18 18  '$ Temp: 98 F (36.7 C) 98.3 F (36.8  C)  98.9 F (37.2 C)  TempSrc: Oral Oral  Axillary  SpO2: 99% 100% 98%   Weight:      Height:       General: alert, cooperative and no distress Lochia: appropriate Uterine Fundus: firm Incision: Dressing is clean, dry, and intact DVT Evaluation: No evidence of DVT seen on physical exam. No significant calf/ankle edema. Labs: Lab Results  Component Value Date   WBC 9.5 12/27/2019   HGB 10.5 (L) 12/27/2019   HCT 32.4 (L) 12/27/2019   MCV 93.4 12/27/2019   PLT 113 (L) 12/27/2019   CMP Latest Ref Rng & Units 12/27/2019  Glucose 70 - 99 mg/dL -  BUN 6 - 20 mg/dL -  Creatinine 0.44 - 1.00 mg/dL 0.54  Sodium 135 - 145 mmol/L -  Potassium 3.5 - 5.1 mmol/L -  Chloride 98 - 111 mmol/L -  CO2 22 - 32 mmol/L -  Calcium 8.9 - 10.3 mg/dL -  Total Protein 6.5 - 8.1 g/dL -  Total Bilirubin 0.3 - 1.2 mg/dL -  Alkaline Phos 38 - 126 U/L -  AST 15 - 41 U/L -  ALT 0 - 44 U/L -   Edinburgh Score: Edinburgh Postnatal Depression Scale Screening Tool 12/27/2019  I have been able to laugh and see the funny side of things. (No Data)     After visit meds:  Allergies as of 12/28/2019   No Known Allergies     Medication List  TAKE these medications   Accu-Chek Guide test strip Generic drug: glucose blood Use to check blood sugars four times a day was instructed   Accu-Chek Guide w/Device Kit 1 Device by Does not apply route 4 (four) times daily.   Accu-Chek Softclix Lancets lancets 1 each by Other route 4 (four) times daily.   Blood Pressure Kit Devi 1 Device by Does not apply route as needed.   Comfort Fit Maternity Supp Sm Misc 1 Units by Does not apply route daily as needed.   ferrous sulfate 325 (65 FE) MG tablet Take 1 tablet (325 mg total) by mouth every other day. Start taking on: December 29, 2019   ibuprofen 600 MG tablet Commonly known as: ADVIL Take 1 tablet (600 mg total) by mouth every 6 (six) hours.   oxyCODONE 5 MG immediate release tablet Commonly known as:  Oxy IR/ROXICODONE Take 1 tablet (5 mg total) by mouth every 4 (four) hours as needed for moderate pain.   polyethylene glycol powder 17 GM/SCOOP powder Commonly known as: GLYCOLAX/MIRALAX Take 17 g by mouth daily as needed.   PrePLUS 27-1 MG Tabs Take 1 tablet by mouth daily.        Discharge home in stable condition Infant Feeding: Bottle Infant Disposition:home with mother Discharge instruction: per After Visit Summary and Postpartum booklet. Activity: Advance as tolerated. Pelvic rest for 6 weeks.  Diet: routine diet Future Appointments: Future Appointments  Date Time Provider [place]  12/29/2019  1:45 PM Griffin Basil, MD [place] None  01/02/2020  8:20 AM [facility] 1 [facility] None   Follow up Visit:   Please schedule this patient for a In person postpartum visit in 4 weeks with the following provider: Any provider. Additional Postpartum F/U:Incision check 1 week , IUD string check at pp visit High risk pregnancy complicated by: M6NOT, h/o 2 CS Delivery mode:  C-Section, Low Transverse  Anticipated Birth Control:  IUD placed during surgery   12/28/2019 Clarnce Flock, MD

## 2019-12-26 NOTE — MAU Note (Signed)
Stephanie Wolfe is a 34 y.o. at [redacted]w[redacted]d here in MAU reporting: contractions since yesterday, reports she is having 4-5 contractions within an hour. No bleeding, no LOF. +FM  Onset of complaint: yesterday  Pain score: 6/10  Vitals:   12/26/19 1413  BP: 113/76  Pulse: (!) 113  Resp: 16  Temp: 98.3 F (36.8 C)  SpO2: 100%     FHT: +FM  Lab orders placed from triage: none

## 2019-12-26 NOTE — H&P (Signed)
OBSTETRIC ADMISSION HISTORY AND PHYSICAL  Stephanie Wolfe is a 34 y.o. female [phone] with IUP at 52w5dpresenting for early labor with a history of 2 CS. She reports +FMs. No LOF, VB, blurry vision, headaches, peripheral edema, or RUQ pain. She plans on bottlefeeding. She requests postplacental IUD for birth control.  Dating: By LMP --->  Estimated Date of Delivery: 01/11/20  Sono:   _0 , normal anatomy, cephalic presentation, 38469G 97%ile, EFW 7#11  Prenatal History/Complications: [id number] bacteriuria Language barrier H/o amniotic fluid embolism/possible PE  Past Medical History: Past Medical History:  Diagnosis Date  . Gestational diabetes   . Medical history non-contributory     Past Surgical History: Past Surgical History:  Procedure Laterality Date  . CESAREAN SECTION      Obstetrical History: OB History    Gravida  5   Para  4   Term  4   Preterm      AB      Living  4     SAB      TAB      Ectopic      Multiple      Live Births  4           Social History: Social History   Socioeconomic History  . Marital status: Married    Spouse name: Not on file  . Number of children: Not on file  . Years of education: Not on file  . Highest education level: Not on file  Occupational History  . Not on file  Tobacco Use  . Smoking status: Never Smoker  . Smokeless tobacco: Never Used  Vaping Use  . Vaping Use: Never used  Substance and Sexual Activity  . Alcohol use: Not Currently  . Drug use: Never  . Sexual activity: Yes  Other Topics Concern  . Not on file  Social History Narrative  . Not on file   Social Determinants of Health   Financial Resource Strain:   . Difficulty of Paying Living Expenses:   Food Insecurity:   . Worried About RCharity fundraiserin the Last Year:   . RArboriculturistin the Last Year:   Transportation Needs:   . LFilm/video editor(Medical):   .Marland KitchenLack of Transportation (Non-Medical):   Physical  Activity:   . Days of Exercise per Week:   . Minutes of Exercise per Session:   Stress:   . Feeling of Stress :   Social Connections:   . Frequency of Communication with Friends and Family:   . Frequency of Social Gatherings with Friends and Family:   . Attends Religious Services:   . Active Member of Clubs or Organizations:   . Attends CArchivistMeetings:   .Marland KitchenMarital Status:     Family History: Family History  Problem Relation Age of Onset  . Cancer Mother   . Hypertension Father   . Cancer Maternal Grandfather   . Cancer Paternal Grandmother   . Hypertension Paternal Grandmother   . Hypertension Paternal Uncle   . Hypertension Paternal Grandfather     Allergies: No Known Allergies  Medications Prior to Admission  Medication Sig Dispense Refill Last Dose  . Accu-Chek Softclix Lancets lancets 1 each by Other route 4 (four) times daily. 100 each 12   . Blood Glucose Monitoring Suppl (ACCU-CHEK GUIDE) w/Device KIT 1 Device by Does not apply route 4 (four) times daily. 1 kit 0   . Blood Pressure Monitoring (BLOOD  PRESSURE KIT) DEVI 1 Device by Does not apply route as needed. 1 each 0   . Elastic Bandages & Supports (COMFORT FIT MATERNITY SUPP SM) MISC 1 Units by Does not apply route daily as needed. 1 each 0   . glucose blood (ACCU-CHEK GUIDE) test strip Use to check blood sugars four times a day was instructed 50 each 12   . Prenatal Vit-Fe Fumarate-FA (PREPLUS) 27-1 MG TABS Take 1 tablet by mouth daily. 30 tablet 13      Review of Systems:  All systems reviewed and negative except as stated in HPI  PE: Blood pressure 122/63, pulse 93, temperature 98 F (36.7 C), temperature source Oral, resp. rate 16, height 5' 4.57" (1.64 m), weight 86.4 kg, last menstrual period 04/06/2019, SpO2 100 %, unknown if currently breastfeeding. General appearance: alert and cooperative Lungs: regular rate and effort Heart: regular rate  Abdomen: soft, non-tender Extremities:  Homans sign is negative, no sign of DVT Presentation: cephalic EFM: 175 bpm, moderate variability, 15x15 accels, no decels Toco: CTX q1-2 minutes Dilation: 1.5 Effacement (%): 50 Station: Ballotable Exam by:: Haynes Bast CNM  Prenatal labs: ABO, Rh: --/--/B POS Performed at [facility], [address] [place], [place] [address]  [phone]) Antibody: NEG (07/10 1725) Rubella: 2.21 (01/08 1154) RPR: Non Reactive (05/04 0849)  HBsAg: Negative (01/08 1154)  HIV: Non Reactive (05/04 0849)  GBS:   Positive in Urine 2 hr GTT abnormal  Prenatal Transfer Tool  Maternal Diabetes: Yes:  Diabetes Type:  Diet controlled Genetic Screening: Normal Maternal Ultrasounds/Referrals: Normal Fetal Ultrasounds or other Referrals:  Referred to Materal Fetal Medicine  Maternal Substance Abuse:  No Significant Maternal Medications:  None Significant Maternal Lab Results: Group B Strep positive  Results for orders placed or performed during the hospital encounter of 12/26/19 (from the past 24 hour(s))  SARS Coronavirus 2 by RT PCR (hospital order, performed in [place] hospital lab) Nasopharyngeal Nasopharyngeal Swab   Collection Time: 12/26/19  5:12 PM   Specimen: Nasopharyngeal Swab  Result Value Ref Range   SARS Coronavirus 2 NEGATIVE NEGATIVE  CBC   Collection Time: 12/26/19  5:25 PM  Result Value Ref Range   WBC 8.3 4.0 - 10.5 K/uL   RBC 4.36 3.87 - 5.11 MIL/uL   Hemoglobin 13.3 12.0 - 15.0 g/dL   HCT 40.8 36 - 46 %   MCV 93.6 80.0 - 100.0 fL   MCH 30.5 26.0 - 34.0 pg   MCHC 32.6 30.0 - 36.0 g/dL   RDW 14.1 11.5 - 15.5 %   Platelets 159 150 - 400 K/uL   nRBC 0.0 0.0 - 0.2 %  Type and screen [place]   Collection Time: 12/26/19  5:25 PM  Result Value Ref Range   ABO/RH(D) B POS    Antibody Screen NEG    Sample Expiration      12/29/2019,2359 Performed at [facility], [place] [address], [place], [place] [address]   ABO/Rh   Collection Time:  12/26/19  6:08 PM  Result Value Ref Range   ABO/RH(D)      B POS Performed at [facility] [address], [place], [place] [address]     Patient Active Problem List   Diagnosis Date Noted  . Diet controlled gestational diabetes mellitus (GDM) in third trimester 12/01/2019  . Language barrier 08/25/2019  . Traumatic injury during pregnancy in second trimester 07/17/2019  . Group B streptococcal bacteriuria 06/30/2019  .  Supervision of other normal pregnancy, antepartum 06/26/2019  . Previous cesarean section complicating pregnancy, antepartum condition or complication 84/78/4128  . History of amniotic fluid embolism 06/26/2019    Assessment: Stephanie Wolfe is a 39 y.o. G5P4004 at 86w5dhere for early labor with history of CS x2   Plan: The risks of cesarean section were discussed with the patient including but were not limited to: bleeding which may require transfusion or reoperation; infection which may require antibiotics; injury to bowel, bladder, ureters or other surrounding organs; injury to the fetus; need for additional procedures including hysterectomy in the event of a life-threatening hemorrhage; placental abnormalities wth subsequent pregnancies, incisional problems, thromboembolic phenomenon and other postoperative/anesthesia complications. The patient concurred with the proposed plan, giving informed written consent for the procedures.  Patient has been NPO since this morning she will remain NPO for procedure. Anesthesia and OR aware.  Preoperative prophylactic antibiotics and SCDs ordered on call to the OR.  To OR when ready.  Merilee Wible L Chelcea Zahn, DO  12/26/2019, 8:11 PM

## 2019-12-26 NOTE — Anesthesia Postprocedure Evaluation (Signed)
Anesthesia Post Note  Patient: Stephanie Wolfe  Procedure(s) Performed: CESAREAN SECTION (N/A )     Patient location during evaluation: PACU Anesthesia Type: Spinal Level of consciousness: oriented and awake and alert Pain management: pain level controlled Vital Signs Assessment: post-procedure vital signs reviewed and stable Respiratory status: spontaneous breathing, respiratory function stable and patient connected to nasal cannula oxygen Cardiovascular status: blood pressure returned to baseline and stable Postop Assessment: no headache, no backache and no apparent nausea or vomiting Anesthetic complications: no   No complications documented.  Last Vitals:  Vitals:   12/26/19 2230 12/26/19 2245  BP: 90/77 114/69  Pulse: 73 68  Resp: (!) 21 14  Temp: 36.9 C   SpO2: 100% 100%    Last Pain:  Vitals:   12/26/19 2245  TempSrc:   PainSc: 5    Pain Goal: Patients Stated Pain Goal: 3 (12/26/19 2200)  LLE Motor Response: Purposeful movement (12/26/19 2245) LLE Sensation: No tingling, No numbness (12/26/19 2245) RLE Motor Response: Purposeful movement (12/26/19 2245) RLE Sensation: No numbness, No tingling (12/26/19 2245) L Sensory Level: L5-Outer lower leg, top of foot, great toe (12/26/19 2245) R Sensory Level: L5-Outer lower leg, top of foot, great toe (12/26/19 2245) Epidural/Spinal Function Cutaneous sensation: Able to Wiggle Toes (12/26/19 2245)  [name] [name]

## 2019-12-26 NOTE — Anesthesia Procedure Notes (Signed)
Spinal  Patient location during procedure: OR Start time: 12/26/2019 8:25 PM End time: 12/26/2019 8:29 PM Staffing Performed: anesthesiologist  Anesthesiologist: Elmer Picker, MD Preanesthetic Checklist Completed: patient identified, IV checked, risks and benefits discussed, surgical consent, monitors and equipment checked, pre-op evaluation and timeout performed Spinal Block Patient position: sitting Prep: DuraPrep and site prepped and draped Patient monitoring: cardiac monitor, continuous pulse ox and blood pressure Approach: midline Location: L3-4 Injection technique: single-shot Needle Needle type: Pencan  Needle gauge: 24 G Needle length: 9 cm Assessment Sensory level: T6 Additional Notes Functioning IV was confirmed and monitors were applied. Sterile prep and drape, including hand hygiene and sterile gloves were used. The patient was positioned and the spine was prepped. The skin was anesthetized with lidocaine.  Free flow of clear CSF was obtained prior to injecting local anesthetic into the CSF.  The spinal needle aspirated freely following injection.  The needle was carefully withdrawn.  The patient tolerated the procedure well.

## 2019-12-26 NOTE — Transfer of Care (Signed)
Immediate Anesthesia Transfer of Care Note  Patient: Stephanie Wolfe  Procedure(s) Performed: CESAREAN SECTION (N/A )  Patient Location: PACU  Anesthesia Type:Spinal  Level of Consciousness: awake, alert  and oriented  Airway & Oxygen Therapy: Patient Spontanous Breathing  Post-op Assessment: Report given to RN and Post -op Vital signs reviewed and stable  Post vital signs: Reviewed and stable  Last Vitals:  Vitals Value Taken Time  BP    Temp    Pulse 85 12/26/19 2201  Resp 17 12/26/19 2201  SpO2 96 % 12/26/19 2201  Vitals shown include unvalidated device data.  Last Pain:  Vitals:   12/26/19 1854  TempSrc: Oral  PainSc:          Complications: No complications documented.

## 2019-12-26 NOTE — Anesthesia Preprocedure Evaluation (Signed)
Anesthesia Evaluation  Patient identified by MRN, date of birth, ID band Patient awake    Reviewed: Allergy & Precautions, NPO status , Patient's Chart, lab work & pertinent test results  Airway Mallampati: II  TM Distance: >3 FB Neck ROM: Full    Dental no notable dental hx.    Pulmonary neg pulmonary ROS,    Pulmonary exam normal breath sounds clear to auscultation       Cardiovascular negative cardio ROS Normal cardiovascular exam Rhythm:Regular Rate:Normal     Neuro/Psych negative neurological ROS  negative psych ROS   GI/Hepatic negative GI ROS, Neg liver ROS,   Endo/Other  negative endocrine ROSdiabetes, Gestational  Renal/GU negative Renal ROS  negative genitourinary   Musculoskeletal negative musculoskeletal ROS (+)   Abdominal   Peds  Hematology negative hematology ROS (+)   Anesthesia Other Findings Repeat C/Sx2  Reproductive/Obstetrics (+) Pregnancy                             Anesthesia Physical Anesthesia Plan  ASA: III  Anesthesia Plan: Spinal   Post-op Pain Management:    Induction:   PONV Risk Score and Plan: 2 and Treatment may vary due to age or medical condition  Airway Management Planned: Natural Airway  Additional Equipment:   Intra-op Plan:   Post-operative Plan:   Informed Consent: I have reviewed the patients History and Physical, chart, labs and discussed the procedure including the risks, benefits and alternatives for the proposed anesthesia with the patient or authorized representative who has indicated his/her understanding and acceptance.     Dental advisory given  Plan Discussed with: CRNA  Anesthesia Plan Comments:         Anesthesia Quick Evaluation

## 2019-12-27 ENCOUNTER — Encounter (HOSPITAL_COMMUNITY): Payer: Self-pay | Admitting: Obstetrics and Gynecology

## 2019-12-27 DIAGNOSIS — Z30430 Encounter for insertion of intrauterine contraceptive device: Secondary | ICD-10-CM

## 2019-12-27 LAB — CREATININE, SERUM
Creatinine, Ser: 0.54 mg/dL (ref 0.44–1.00)
GFR calc Af Amer: >60 mL/min (ref >60)
GFR calc non Af Amer: >60 mL/min (ref >60)

## 2019-12-27 LAB — CBC
HCT: 32.4 % — ABNORMAL LOW (ref 36.0–46.0)
Hemoglobin: 10.5 g/dL — ABNORMAL LOW (ref 12.0–15.0)
MCH: 30.3 pg (ref 26.0–34.0)
MCHC: 32.4 g/dL (ref 30.0–36.0)
MCV: 93.4 fL (ref 80.0–100.0)
Platelets: 113 10*3/uL — ABNORMAL LOW (ref 150–400)
RBC: 3.47 MIL/uL — ABNORMAL LOW (ref 3.87–5.11)
RDW: 14 % (ref 11.5–15.5)
WBC: 9.5 10*3/uL (ref 4.0–10.5)
nRBC: 0 % (ref 0.0–0.2)

## 2019-12-27 LAB — RPR: RPR Ser Ql: NONREACTIVE

## 2019-12-27 MED ORDER — ACETAMINOPHEN 500 MG PO TABS
1000.0000 mg | ORAL_TABLET | Freq: Four times a day (QID) | ORAL | Status: AC
  Administered 2019-12-27 (×3): 1000 mg via ORAL
  Filled 2019-12-27 (×3): qty 2

## 2019-12-27 MED ORDER — DIPHENHYDRAMINE HCL 50 MG/ML IJ SOLN: 12.5000 mg | INTRAMUSCULAR | Status: DC | PRN

## 2019-12-27 MED ORDER — OXYCODONE HCL 5 MG PO TABS
5.0000 mg | ORAL_TABLET | ORAL | Status: DC | PRN
  Administered 2019-12-28 (×2): 5 mg via ORAL
  Filled 2019-12-27 (×2): qty 1

## 2019-12-27 MED ORDER — KETOROLAC TROMETHAMINE 30 MG/ML IJ SOLN
30.0000 mg | Freq: Four times a day (QID) | INTRAMUSCULAR | Status: AC
  Administered 2019-12-27 (×3): 30 mg via INTRAVENOUS
  Filled 2019-12-27 (×3): qty 1

## 2019-12-27 MED ORDER — OXYTOCIN-SODIUM CHLORIDE 30-0.9 UT/500ML-% IV SOLN: 2.5000 [IU]/h | INTRAVENOUS | Status: AC

## 2019-12-27 MED ORDER — DIPHENHYDRAMINE HCL 25 MG PO CAPS: 25.0000 mg | ORAL_CAPSULE | ORAL | Status: DC | PRN

## 2019-12-27 MED ORDER — ENOXAPARIN SODIUM 40 MG/0.4ML ~~LOC~~ SOLN: 40.0000 mg | SUBCUTANEOUS | Status: DC

## 2019-12-27 MED ORDER — WITCH HAZEL-GLYCERIN EX PADS: 1.0000 "application " | MEDICATED_PAD | CUTANEOUS | Status: DC | PRN

## 2019-12-27 MED ORDER — SCOPOLAMINE 1 MG/3DAYS TD PT72: 1.0000 | MEDICATED_PATCH | Freq: Once | TRANSDERMAL | Status: DC

## 2019-12-27 MED ORDER — SIMETHICONE 80 MG PO CHEW
80.0000 mg | CHEWABLE_TABLET | ORAL | Status: DC
  Administered 2019-12-28: 80 mg via ORAL
  Filled 2019-12-27: qty 1

## 2019-12-27 MED ORDER — NALBUPHINE HCL 10 MG/ML IJ SOLN: 5.0000 mg | Freq: Once | INTRAMUSCULAR | Status: DC | PRN

## 2019-12-27 MED ORDER — DIBUCAINE (PERIANAL) 1 % EX OINT: 1.0000 "application " | TOPICAL_OINTMENT | CUTANEOUS | Status: DC | PRN

## 2019-12-27 MED ORDER — NALBUPHINE HCL 10 MG/ML IJ SOLN: 5.0000 mg | INTRAMUSCULAR | Status: DC | PRN

## 2019-12-27 MED ORDER — ACETAMINOPHEN 500 MG PO TABS
1000.0000 mg | ORAL_TABLET | Freq: Four times a day (QID) | ORAL | Status: DC
Start: 2019-12-27 — End: 2019-12-27

## 2019-12-27 MED ORDER — ONDANSETRON HCL 4 MG/2ML IJ SOLN: 4.0000 mg | Freq: Three times a day (TID) | INTRAMUSCULAR | Status: DC | PRN

## 2019-12-27 MED ORDER — ZOLPIDEM TARTRATE 5 MG PO TABS: 5.0000 mg | ORAL_TABLET | Freq: Every evening | ORAL | Status: DC | PRN

## 2019-12-27 MED ORDER — SODIUM CHLORIDE 0.9% FLUSH: 3.0000 mL | INTRAVENOUS | Status: DC | PRN

## 2019-12-27 MED ORDER — FERROUS SULFATE 325 (65 FE) MG PO TABS
325.0000 mg | ORAL_TABLET | ORAL | Status: DC
  Administered 2019-12-27: 325 mg via ORAL
  Filled 2019-12-27: qty 1

## 2019-12-27 MED ORDER — COCONUT OIL OIL: 1.0000 "application " | TOPICAL_OIL | Status: DC | PRN

## 2019-12-27 MED ORDER — LACTATED RINGERS IV SOLN: INTRAVENOUS | Status: DC

## 2019-12-27 MED ORDER — LACTATED RINGERS IV BOLUS
1000.0000 mL | Freq: Once | INTRAVENOUS | Status: AC
  Administered 2019-12-27: 1000 mL via INTRAVENOUS

## 2019-12-27 MED ORDER — SENNOSIDES-DOCUSATE SODIUM 8.6-50 MG PO TABS
2.0000 | ORAL_TABLET | ORAL | Status: DC
  Administered 2019-12-28: 2 via ORAL
  Filled 2019-12-27: qty 2

## 2019-12-27 MED ORDER — HYDROMORPHONE HCL 1 MG/ML IJ SOLN: 0.2000 mg | INTRAMUSCULAR | Status: DC | PRN

## 2019-12-27 MED ORDER — IBUPROFEN 800 MG PO TABS
800.0000 mg | ORAL_TABLET | Freq: Four times a day (QID) | ORAL | Status: DC
  Administered 2019-12-28 (×3): 800 mg via ORAL
  Filled 2019-12-27 (×3): qty 1

## 2019-12-27 MED ORDER — KETOROLAC TROMETHAMINE 30 MG/ML IJ SOLN: 30.0000 mg | Freq: Four times a day (QID) | INTRAMUSCULAR | Status: AC | PRN

## 2019-12-27 MED ORDER — NALOXONE HCL 0.4 MG/ML IJ SOLN: 0.4000 mg | INTRAMUSCULAR | Status: DC | PRN

## 2019-12-27 MED ORDER — NALOXONE HCL 4 MG/10ML IJ SOLN
1.0000 ug/kg/h | INTRAVENOUS | Status: DC | PRN
  Filled 2019-12-27: qty 5

## 2019-12-27 MED ORDER — SIMETHICONE 80 MG PO CHEW
80.0000 mg | CHEWABLE_TABLET | Freq: Three times a day (TID) | ORAL | Status: DC
  Administered 2019-12-27 – 2019-12-28 (×3): 80 mg via ORAL
  Filled 2019-12-27 (×3): qty 1

## 2019-12-27 MED ORDER — SIMETHICONE 80 MG PO CHEW: 80.0000 mg | CHEWABLE_TABLET | ORAL | Status: DC | PRN

## 2019-12-27 MED ORDER — TETANUS-DIPHTH-ACELL PERTUSSIS 5-2.5-18.5 LF-MCG/0.5 IM SUSP: 0.5000 mL | Freq: Once | INTRAMUSCULAR | Status: DC

## 2019-12-27 MED ORDER — MENTHOL 3 MG MT LOZG: 1.0000 | LOZENGE | OROMUCOSAL | Status: DC | PRN

## 2019-12-27 MED ORDER — DIPHENHYDRAMINE HCL 25 MG PO CAPS: 25.0000 mg | ORAL_CAPSULE | Freq: Four times a day (QID) | ORAL | Status: DC | PRN

## 2019-12-27 MED ORDER — PRENATAL MULTIVITAMIN CH
1.0000 | ORAL_TABLET | Freq: Every day | ORAL | Status: DC
  Administered 2019-12-27 – 2019-12-28 (×2): 1 via ORAL
  Filled 2019-12-27 (×2): qty 1

## 2019-12-27 NOTE — Progress Notes (Signed)
Called MD due to urine output being 72ml in last 4 hours.  Per CNM, administer fluid bolus.  See new orders in [facility].

## 2019-12-27 NOTE — Progress Notes (Addendum)
POSTPARTUM PROGRESS NOTE Mandarin interpreter used for this encounter  Subjective: Stephanie Wolfe is a 34 y.o. [id number] POD#1 s/p rLTCS at [redacted]w[redacted]d.  She reports she doing well. No acute events overnight. She has not yet with ambulated, voided or had po intake. Denies nausea or vomiting. She has passed flatus. Pain is well controlled.  Lochia is appropriate.  Objective: Blood pressure 103/63, pulse 74, temperature 98.1 F (36.7 C), temperature source Oral, resp. rate 20, height 5' 4.57" (1.64 m), weight 86.4 kg, last menstrual period 04/06/2019, SpO2 98 %, unknown if currently breastfeeding.  Physical Exam:  General: alert, cooperative and no distress Chest: no respiratory distress Abdomen: soft, non-tender  Uterine Fundus: firm, appropriately tender Extremities: No calf swelling or tenderness  No edema  Recent Labs    12/26/19 1725 12/27/19 0544  HGB 13.3 10.5*  HCT 40.8 32.4*    Assessment/Plan: Stephanie Wolfe is a 7 y.o. [id number] POD#1 s/p rLTCS at [redacted]w[redacted]d.  Routine Postpartum Care: Doing well, pain well-controlled.  -- Continue routine care, lactation support  -- Contraception: pp IUD placed during surgery -- Feeding: bottle  Dispo: Plan for discharge tomorrow or POD#3.  Marlowe Alt, DO OB/GYN Fellow, [facility] for [facility]

## 2019-12-27 NOTE — Progress Notes (Signed)
Used video manderin intrepreter to ambulate patient to bathroom and educated on medications being given. Patient verbalized understanding. Interpreter Viviann Spare # [phone]

## 2019-12-28 MED ORDER — POLYETHYLENE GLYCOL 3350 17 GM/SCOOP PO POWD
17.0000 g | Freq: Every day | ORAL | 1 refills | Status: DC | PRN
Start: 2019-12-28 — End: 2020-04-27

## 2019-12-28 MED ORDER — IBUPROFEN 600 MG PO TABS: 600.0000 mg | ORAL_TABLET | Freq: Four times a day (QID) | ORAL | 0 refills | Status: DC

## 2019-12-28 MED ORDER — FERROUS SULFATE 325 (65 FE) MG PO TABS: 325.0000 mg | ORAL_TABLET | ORAL | 1 refills | Status: DC

## 2019-12-28 MED ORDER — OXYCODONE HCL 5 MG PO TABS: 5.0000 mg | ORAL_TABLET | ORAL | 0 refills | Status: DC | PRN

## 2019-12-29 ENCOUNTER — Encounter: Payer: Medicaid Other | Admitting: Obstetrics and Gynecology

## 2019-12-31 ENCOUNTER — Inpatient Hospital Stay (HOSPITAL_COMMUNITY): Payer: Medicaid Other

## 2019-12-31 ENCOUNTER — Inpatient Hospital Stay (HOSPITAL_COMMUNITY)
Admission: AD | Admit: 2019-12-31 | Discharge: 2019-12-31 | Disposition: A | Discharge status: Home / Self Care | Payer: Medicaid Other | Attending: Family Medicine | Admitting: Family Medicine

## 2019-12-31 ENCOUNTER — Encounter (HOSPITAL_COMMUNITY): Payer: Self-pay | Admitting: Family Medicine

## 2019-12-31 DIAGNOSIS — J810 Acute pulmonary edema: Secondary | ICD-10-CM | POA: Diagnosis not present

## 2019-12-31 DIAGNOSIS — R109 Unspecified abdominal pain: Secondary | ICD-10-CM | POA: Insufficient documentation

## 2019-12-31 DIAGNOSIS — O99893 Other specified diseases and conditions complicating puerperium: Secondary | ICD-10-CM

## 2019-12-31 DIAGNOSIS — Z79899 Other long term (current) drug therapy: Secondary | ICD-10-CM | POA: Diagnosis not present

## 2019-12-31 DIAGNOSIS — G8918 Other acute postprocedural pain: Secondary | ICD-10-CM | POA: Diagnosis not present

## 2019-12-31 DIAGNOSIS — R0602 Shortness of breath: Secondary | ICD-10-CM | POA: Diagnosis not present

## 2019-12-31 DIAGNOSIS — J90 Pleural effusion, not elsewhere classified: Secondary | ICD-10-CM | POA: Diagnosis not present

## 2019-12-31 DIAGNOSIS — O9953 Diseases of the respiratory system complicating the puerperium: Secondary | ICD-10-CM | POA: Diagnosis not present

## 2019-12-31 DIAGNOSIS — R0601 Orthopnea: Secondary | ICD-10-CM | POA: Insufficient documentation

## 2019-12-31 LAB — COMPREHENSIVE METABOLIC PANEL
ALT: 26 U/L (ref 0–44)
AST: 21 U/L (ref 15–41)
Albumin: 2.5 g/dL — ABNORMAL LOW (ref 3.5–5.0)
Alkaline Phosphatase: 94 U/L (ref 38–126)
Anion gap: 9 (ref 5–15)
BUN: 10 mg/dL (ref 6–20)
CO2: 23 mmol/L (ref 22–32)
Calcium: 8.4 mg/dL — ABNORMAL LOW (ref 8.9–10.3)
Chloride: 108 mmol/L (ref 98–111)
Creatinine, Ser: 0.66 mg/dL (ref 0.44–1.00)
GFR calc Af Amer: >60 mL/min (ref >60)
GFR calc non Af Amer: >60 mL/min (ref >60)
Glucose, Bld: 87 mg/dL (ref 70–99)
Potassium: 3.8 mmol/L (ref 3.5–5.1)
Sodium: 140 mmol/L (ref 135–145)
Total Bilirubin: 0.2 mg/dL — ABNORMAL LOW (ref 0.3–1.2)
Total Protein: 5.2 g/dL — ABNORMAL LOW (ref 6.5–8.1)

## 2019-12-31 LAB — CBC
HCT: 32.5 % — ABNORMAL LOW (ref 36.0–46.0)
Hemoglobin: 10.3 g/dL — ABNORMAL LOW (ref 12.0–15.0)
MCH: 29.8 pg (ref 26.0–34.0)
MCHC: 31.7 g/dL (ref 30.0–36.0)
MCV: 93.9 fL (ref 80.0–100.0)
Platelets: 153 10*3/uL (ref 150–400)
RBC: 3.46 MIL/uL — ABNORMAL LOW (ref 3.87–5.11)
RDW: 13.9 % (ref 11.5–15.5)
WBC: 4.9 10*3/uL (ref 4.0–10.5)
nRBC: 0 % (ref 0.0–0.2)

## 2019-12-31 MED ORDER — FUROSEMIDE 10 MG/ML IJ SOLN
20.0000 mg | Freq: Once | INTRAMUSCULAR | Status: AC
  Administered 2019-12-31: 20 mg via INTRAVENOUS
  Filled 2019-12-31: qty 2

## 2019-12-31 MED ORDER — FUROSEMIDE 20 MG PO TABS
20.0000 mg | ORAL_TABLET | Freq: Once | ORAL | Status: DC
  Filled 2019-12-31: qty 1

## 2019-12-31 MED ORDER — IOHEXOL 350 MG/ML SOLN
75.0000 mL | Freq: Once | INTRAVENOUS | Status: AC | PRN
  Administered 2019-12-31: 75 mL via INTRAVENOUS

## 2019-12-31 NOTE — MAU Provider Note (Addendum)
Chief Complaint:  Shortness of Breath   First Provider Initiated Contact with Patient 12/31/19 0505    HPI: Stephanie Wolfe is a 34 y.o. [id number] who presents to maternity admissions reporting shortness of breath when lying flat.  .Chart review shows she complained of that also in June.  States had some kind of episode with her third baby and was told she had an amniotic fluid embolism and was in ICU.  ROI signed but there are no records under media tab.  Does not recall being put on thrombolytic meds afterward.   Has some mild postop pain.  She reports no vaginal itching/burning, urinary symptoms, h/a, dizziness, n/v, or fever/chills.    Shortness of Breath This is a new problem. The current episode started yesterday. The problem occurs intermittently. Associated symptoms include abdominal pain (mild incisional pain) and orthopnea. Pertinent negatives include no chest pain, fever, headaches, leg pain, leg swelling, rhinorrhea, sore throat, sputum production, syncope, vomiting or wheezing. The symptoms are aggravated by lying flat. Treatments tried: propping up on pillows. The treatment provided mild relief.   RN Note: Pt reports to MAU c/o SOB when lying down. Pt states she has to use extra pillows to prop her up to help her breathe. Pt reports some mild pain in her abdomen from her surgery. Pt reports some chest pain that hurts when she breathes.   Past Medical History: Past Medical History:  Diagnosis Date   Gestational diabetes    Medical history non-contributory     Past obstetric history: OB History  Gravida Para Term Preterm AB Living  _0 SAB TAB Ectopic Multiple Live Births        0 5    # Outcome Date GA Lbr Len/2nd Weight Sex Delivery Anes PTL Lv  5 Term 12/26/19 [redacted]w[redacted]d 3910 g M CS-LTranv Spinal  LIV  4 Term 11/29/16    F CS-LTranv   LIV     Complications: Breech presentation  3 Term 12/29/15    F VBAC   LIV  2 Term 01/18/11    F VBAC   LIV  1 Term 02/12/08    F  CS-LTranv   LIV     Birth Comments: had PP infection     Complications: Failure to Progress in First Stage    Past Surgical History: Past Surgical History:  Procedure Laterality Date   CESAREAN SECTION     CESAREAN SECTION N/A 12/26/2019   Procedure: CESAREAN SECTION;  Surgeon: EChancy Milroy MD;  Location: [facility];  Service: Obstetrics;  Laterality: N/A;    Family History: Family History  Problem Relation Age of Onset   Cancer Mother    Hypertension Father    Cancer Maternal Grandfather    Cancer Paternal Grandmother    Hypertension Paternal Grandmother    Hypertension Paternal Uncle    Hypertension Paternal Grandfather     Social History: Social History   Tobacco Use   Smoking status: Never Smoker   Smokeless tobacco: Never Used  VScientific laboratory technicianUse: Never used  Substance Use Topics   Alcohol use: Not Currently   Drug use: Never    Allergies: No Known Allergies  Meds:  Medications Prior to Admission  Medication Sig Dispense Refill Last Dose   Accu-Chek Softclix Lancets lancets 1 each by Other route 4 (four) times daily. 100 each 12    Blood Glucose Monitoring Suppl (ACCU-CHEK GUIDE) w/Device KIT 1 Device by  Does not apply route 4 (four) times daily. 1 kit 0    Blood Pressure Monitoring (BLOOD PRESSURE KIT) DEVI 1 Device by Does not apply route as needed. 1 each 0    Elastic Bandages & Supports (COMFORT FIT MATERNITY SUPP SM) MISC 1 Units by Does not apply route daily as needed. 1 each 0    ferrous sulfate 325 (65 FE) MG tablet Take 1 tablet (325 mg total) by mouth every other day. 30 tablet 1    glucose blood (ACCU-CHEK GUIDE) test strip Use to check blood sugars four times a day was instructed 50 each 12    ibuprofen (ADVIL) 600 MG tablet Take 1 tablet (600 mg total) by mouth every 6 (six) hours. 30 tablet 0    oxyCODONE (OXY IR/ROXICODONE) 5 MG immediate release tablet Take 1 tablet (5 mg total) by mouth every 4 (four) hours as  needed for moderate pain. 20 tablet 0    polyethylene glycol powder (GLYCOLAX/MIRALAX) 17 GM/SCOOP powder Take 17 g by mouth daily as needed. 510 g 1    Prenatal Vit-Fe Fumarate-FA (PREPLUS) 27-1 MG TABS Take 1 tablet by mouth daily. 30 tablet 13     I have reviewed patient's Past Medical Hx, Surgical Hx, Family Hx, Social Hx, medications and allergies.  ROS:  Review of Systems  Constitutional: Negative for fever.  HENT: Negative for rhinorrhea and sore throat.   Respiratory: Positive for shortness of breath. Negative for sputum production and wheezing.   Cardiovascular: Positive for orthopnea. Negative for chest pain, leg swelling and syncope.  Gastrointestinal: Positive for abdominal pain (mild incisional pain). Negative for vomiting.  Neurological: Negative for headaches.   Other systems negative     Physical Exam   Patient Vitals for the past 24 hrs:  BP Temp Temp src Pulse SpO2  12/31/19 0500 131/78 -- -- (!) 51 99 %  12/31/19 0450 123/61 98.2 F (36.8 C) Oral (!) 48 --  12/31/19 0449 -- -- -- -- 99 %   Constitutional: Well-developed, well-nourished female in no acute distress.  Cardiovascular: mild bradycardia,  normal rhythm, no ectopy audible, S1 & S2 heard, no murmur Respiratory: normal effort, no distress. Lungs CTAB with no wheezes or crackles   Does not appear dyspneic when flat for EKG but reports she is short of breath GI: Abd soft, non-tender.  Nondistended.  No rebound, No guarding.  Bowel Sounds audible  MS: Extremities nontender, no edema, normal ROM Neurologic: Alert and oriented x 4.   Grossly nonfocal. GU: Neg CVAT. Skin:  Warm and Dry Psych:  Affect appropriate.  PELVIC EXAM: deferred   Labs: Results for orders placed or performed during the hospital encounter of 12/31/19 (from the past 24 hour(s))  CBC     Status: Abnormal   Collection Time: 12/31/19  5:00 AM  Result Value Ref Range   WBC 4.9 4.0 - 10.5 K/uL   RBC 3.46 (L) 3.87 - 5.11 MIL/uL    Hemoglobin 10.3 (L) 12.0 - 15.0 g/dL   HCT 32.5 (L) 36 - 46 %   MCV 93.9 80.0 - 100.0 fL   MCH 29.8 26.0 - 34.0 pg   MCHC 31.7 30.0 - 36.0 g/dL   RDW 13.9 11.5 - 15.5 %   Platelets 153 150 - 400 K/uL   nRBC 0.0 0.0 - 0.2 %  Comprehensive metabolic panel     Status: Abnormal (Preliminary result)   Collection Time: 12/31/19  5:00 AM  Result Value Ref Range   Sodium  140 135 - 145 mmol/L   Potassium 3.8 3.5 - 5.1 mmol/L   Chloride 108 98 - 111 mmol/L   CO2 23 22 - 32 mmol/L   Glucose, Bld 87 70 - 99 mg/dL   BUN 10 6 - 20 mg/dL   Creatinine, Ser 0.66 0.44 - 1.00 mg/dL   Calcium 8.4 (L) 8.9 - 10.3 mg/dL   Total Protein 5.2 (L) 6.5 - 8.1 g/dL   Albumin 2.5 (L) 3.5 - 5.0 g/dL   AST 21 15 - 41 U/L   ALT 26 0 - 44 U/L   Alkaline Phosphatase 94 38 - 126 U/L   Total Bilirubin PENDING 0.3 - 1.2 mg/dL   GFR calc non Af Amer >60 >60 mL/min   GFR calc Af Amer >60 >60 mL/min   Anion gap 9 5 - 15    --/--/B POS Performed at [facility] [address], [place], [place] [address]  [phone])  Imaging:  CT Angio Chest PE W and/or Wo Contrast  Result Date: 12/31/2019 CLINICAL DATA:  Chest pain and shortness of breath when laying down. EXAM: CT ANGIOGRAPHY CHEST WITH CONTRAST TECHNIQUE: Multidetector CT imaging of the chest was performed using the standard protocol during bolus administration of intravenous contrast. Multiplanar CT image reconstructions and MIPs were obtained to evaluate the vascular anatomy. CONTRAST:  73m OMNIPAQUE IOHEXOL 350 MG/ML SOLN COMPARISON:  None. FINDINGS: Cardiovascular: Satisfactory opacification of the pulmonary arteries to the segmental level. No evidence of pulmonary embolism when allowing for levels of streak artifact. Normal heart size. No pericardial effusion. Mediastinum/Nodes: Negative for adenopathy or mass Lungs/Pleura: Small layering pleural effusions. Generalized airway cuffing and minimal interlobular septal thickening at the bases and apices. No  consolidation. Upper Abdomen: Generous splenic size, stable from abdominal CT 07/16/2018 Musculoskeletal: Negative Review of the MIP images confirms the above findings. IMPRESSION: Small pleural effusions and minimal interstitial edema. No evidence of pulmonary embolism. Electronically Signed   By: JMonte FantasiaM.D.   On: 12/31/2019 07:00    MAU Course/MDM: I have ordered labs as follows:  CBC and CMET are WNL Imaging ordered: CT Angiogram to rule out PE due to questionable history or ?AFE vs ?PE  Vs ?pulm edema Results reviewed with Dr PKennon Rounds  Will give a dose of Lasix to assist in clearing edema prior to discharge.  Has appointment in office in 4 days for incision check, so can followup then    Treatments in MAU included Lasix .   Pt stable at time of discharge.  Assessment: Post-Op Day #5 Post repeat Cesarean Delivery Shortness of breath when flat/Orthopnea Small pulmonary effusions and minimal interstitial edema  Plan: Discharge home Respiratory precautions Followup status in office as scheduled 01/04/20 Encouraged to return here or to other Urgent Care/ED if she develops worsening of symptoms, increase in pain, fever, or other concerning symptoms.      MHansel FeinsteinCNM, MSN Certified Nurse-Midwife 12/31/2019 5:28 AM

## 2019-12-31 NOTE — MAU Note (Addendum)
Pt reports to MAU c/o SOB when lying down. Pt states she has to use extra pillows to prop her up to help her breathe. Pt reports some mild pain in her abdomen from her surgery. Pt reports some chest pain that hurts when she breathes.

## 2019-12-31 NOTE — Discharge Instructions (Signed)
Pleural Effusion Pleural effusion is an abnormal buildup of fluid in the layers of tissue between the lungs and the inside of the chest (pleural space) The two layers of tissue that line the lungs and the inside of the chest are called pleura. Usually, there is no air in the space between the pleura, only a thin layer of fluid. Some conditions can cause a large amount of fluid to build up, which can cause the lung to collapse if untreated. A pleural effusion is usually caused by another disease that requires treatment. What are the signs or symptoms? In some cases, pleural effusion may cause no symptoms. If symptoms are present, they may include:  Shortness of breath, especially when lying down.  Chest pain. This may get worse when taking a deep breath.  Fever.  Dry, long-lasting (chronic) cough.  Hiccups.  Rapid breathing. An underlying condition that is causing the pleural effusion (such as heart failure, pneumonia, blood clots, tuberculosis, or cancer) may also cause other symptoms. How is this diagnosed? This condition may be diagnosed based on:  Your symptoms and medical history.  A physical exam.  A chest X-ray.  A procedure to use a needle to remove fluid from the pleural space (thoracentesis). This fluid is tested.  Other imaging studies of the chest, such as ultrasound or CT scan. How is this treated? Depending on the cause of your condition, treatment may include:  Treating the underlying condition that is causing the effusion. When that condition improves, the effusion will also improve. Examples of treatment for underlying conditions include: ? Antibiotic medicines to treat an infection. ? Diuretics or other heart medicines to treat heart failure.  Follow these instructions at home:  Take over-the-counter and prescription medicines only as told by your health care provider.  Ask your health care provider what activities are safe for you.  Keep track of how long  you are able to do mild exercise (such as walking) before you get short of breath. Write down this information to share with your health care provider. Your ability to exercise should improve over time.  Do not use any products that contain nicotine or tobacco, such as cigarettes and e-cigarettes. If you need help quitting, ask your health care provider.  Keep all follow-up visits as told by your health care provider. This is important. Contact a health care provider if:  The amount of time that you are able to do mild exercise: ? Decreases. ? Does not improve with time.  You have a fever. Get help right away if:  You are short of breath.  You develop chest pain.  You develop a new cough. Summary  Pleural effusion is an abnormal buildup of fluid in the layers of tissue between the lungs and the inside of the chest.  Pleural effusion can have many causes, including heart failure, pulmonary embolism, infections, or cancer.  Symptoms of pleural effusion can include shortness of breath, chest pain, fever, long-lasting (chronic) cough, hiccups, or rapid breathing.  Diagnosis often involves making images of the chest (such as with ultrasound or X-ray) and removing fluid (thoracentesis) to send for testing.  Treatment for pleural effusion depends on what underlying condition is causing it. This information is not intended to replace advice given to you by your health care provider. Make sure you discuss any questions you have with your health care provider. Document Revised: 05/17/2017 Document Reviewed: 02/07/2017 Elsevier Patient Education  2020 Elsevier Inc[employer]   Pulmonary Edema Pulmonary edema is unusual  buildup of fluid in the lungs. It makes it hard for a person to breathe. This condition is an emergency. Follow these instructions at home: Medicines  Take over-the-counter and prescription medicines only as told by your doctor.  If you were prescribed an antibiotic medicine,  take it as told by your doctor. Do not stop taking the antibiotic even if you start to feel better.  Ask your doctor to help you write a plan with information about each medicine you take. This may include: ? Why you are taking it. ? Possible side effects. ? Best time of day to take it. ? Foods to take with it, or foods to avoid. ? When to stop taking it.  Make a list of each medicine, vitamin, or herbal supplement you take. ? Keep the list with you at all times. ? Show the list to your doctor at each visit and before starting a new medicine. ? Keep the list up to date. Lifestyle   Do regular exercise as told by your doctor. It is important to do it safely. You can do this by: ? Asking your doctor what exercises and activities are good and safe for you to do. ? Pacing your activities. This will prevent shortness of breath or chest pain. ? Resting for at least 1 hour before and after meals. ? Asking about cardiac rehabilitation programs. These may include:  Education.  Exercise plans.  Counseling.  Eat a heart-healthy diet that is low in salt, saturated fat, and cholesterol. Your doctor may suggest foods that are high in fiber, such as: ? Fresh fruits and vegetables. ? Whole grains. ? Beans.  Do not use any products that contain nicotine or tobacco, such as cigarettes and e-cigarettes. If you need help quitting, ask your doctor. General instructions  Keep a record of your weight. ? Record your hospital or clinic weight. When you get home, compare it to your scale and record your weight. ? Weigh yourself first thing in the morning each day, and record the weights. You should weigh yourself every morning after you pee and before you eat breakfast. Wear the same amount of clothing each time you weigh yourself. ? Share your weight record with your doctor. These can help your doctor see if your body is holding extra fluid. ? Tell your doctor right away if you have gained weight  quickly, or if you have gained weight as told by your doctor. Your medicines may need to be adjusted.  Check your blood pressure as often as told by your doctor. ? Buy a home blood pressure cuff at your drugstore. ? Record your blood pressure readings. Bring them with you for your clinic visits.  Stay at a healthy weight. Ask your doctor what weight is healthy for you.  Think about doing therapy or being a part of a support group.  Keep all follow-up visits as told by your doctor. This is important. Contact a doctor if:  You have questions about your medicines.  You miss a dose of your medicine. Get help right away if:  You have very bad chest pain, especially if the pain is crushing or pressure-like and spreads to the arms, back, neck, or jaw.  You have more swelling in your hands, feet, ankles, or belly (abdomen).  You feel sick to your stomach (nauseous).  You have strange sweating.  Your skin turns blue or pale.  Your shortness of breath gets worse.  You feel dizzy or unsteady.  Your vision is  blurry.  You have a headache.  You cough up bloody split.  You cannot sleep because it is hard to breathe.  You start to feel a "jumping" or "fluttering" sensation (palpitations) in the chest that is unusual for you.  You feel like you cannot get enough air.  You gain weight rapidly. These symptoms may be an emergency. Do not wait to see if the symptoms will go away. Get medical help right away. Call your local emergency services (911 in the [place] Do not drive yourself to the hospital. Summary  Pulmonary edema is unusual fluid buildup in the lungs that can make it hard to breathe.  This condition is an emergency.  Keep a record of your weight. Call your doctor if you gain weight rapidly. This information is not intended to replace advice given to you by your health care provider. Make sure you discuss any questions you have with your health care provider. Document  Revised: 05/17/2017 Document Reviewed: 09/04/2016 Elsevier Patient Education  2020 [employer].

## 2020-01-02 ENCOUNTER — Other Ambulatory Visit (HOSPITAL_COMMUNITY)
Admission: RE | Admit: 2020-01-02 | Discharge: 2020-01-02 | Disposition: A | Discharge status: Home / Self Care | Payer: Medicaid Other | Source: Ambulatory Visit | Attending: Obstetrics and Gynecology | Admitting: Obstetrics and Gynecology

## 2020-01-02 HISTORY — DX: Gestational diabetes mellitus in pregnancy, unspecified control: O24.419

## 2020-01-04 ENCOUNTER — Inpatient Hospital Stay (HOSPITAL_COMMUNITY): Admit: 2020-01-04 | Payer: Medicaid Other | Admitting: Obstetrics and Gynecology

## 2020-01-04 ENCOUNTER — Other Ambulatory Visit: Payer: Self-pay

## 2020-01-04 ENCOUNTER — Ambulatory Visit (INDEPENDENT_AMBULATORY_CARE_PROVIDER_SITE_OTHER): Payer: Medicaid Other

## 2020-01-04 DIAGNOSIS — Z4889 Encounter for other specified surgical aftercare: Secondary | ICD-10-CM

## 2020-01-04 NOTE — Progress Notes (Signed)
Nurse visit for incision check s/p CS-L TV 12/26/19. I removed honeycomb dressing and steristrips w/o difficulty. Healing well, no drainage, no erythema, no dehiscence, incision well approximated.  Wound care discussed Pt to keep upcoming routine postpartum visit.

## 2020-01-06 NOTE — Progress Notes (Signed)
Patient was assessed and managed by nursing staff during this encounter. I have reviewed the chart and agree with the documentation and plan. I have also made any necessary editorial changes.  Catalina Antigua, MD 01/06/2020 10:20 AM

## 2020-01-28 ENCOUNTER — Other Ambulatory Visit: Payer: Self-pay

## 2020-01-28 ENCOUNTER — Ambulatory Visit (INDEPENDENT_AMBULATORY_CARE_PROVIDER_SITE_OTHER): Payer: Medicaid Other | Admitting: Obstetrics & Gynecology

## 2020-01-28 ENCOUNTER — Encounter: Payer: Self-pay | Admitting: Obstetrics & Gynecology

## 2020-01-28 DIAGNOSIS — Z30431 Encounter for routine checking of intrauterine contraceptive device: Secondary | ICD-10-CM

## 2020-01-28 DIAGNOSIS — Z8632 Personal history of gestational diabetes: Secondary | ICD-10-CM | POA: Insufficient documentation

## 2020-01-28 DIAGNOSIS — Z975 Presence of (intrauterine) contraceptive device: Secondary | ICD-10-CM | POA: Insufficient documentation

## 2020-01-28 NOTE — Patient Instructions (Signed)
Return to clinic for any scheduled appointments or for any gynecologic concerns as needed.   

## 2020-01-28 NOTE — Progress Notes (Signed)
. ° ° °  Post Partum Visit Note  Stephanie Wolfe is a 34 y.o. [id number] female who presents for a postpartum visit. Mandarin interpreter present. She is  [redacted]w[redacted]d postpartum following a primary cesarean section.  I have fully reviewed the prenatal and intrapartum course. The delivery was at 37 gestational weeks, presented in early labor, breech presentation, with history of two prior cesarean sections.  Anesthesia: spinal. Postpartum course has been well. Baby is doing well. Baby is feeding by bottle - Patient is unsure. Bleeding no bleeding. Bowel function is normal. Bladder function is normal. Patient is not sexually active. Contraception method is Liletta IUD, placed during cesarean section on 12/26/2019. Postpartum depression screening: negative.  The following portions of the patient's history were reviewed and updated as appropriate: allergies, current medications, past family history, past medical history, past social history, past surgical history and problem list.  Review of Systems Pertinent items noted in HPI and remainder of comprehensive ROS otherwise negative.    Objective:  Blood pressure 104/71, pulse 85, weight 170 lb 3.2 oz (77.2 kg), unknown if currently breastfeeding.  General:  alert, cooperative and no distress   Breasts:  deferred  Lungs: clear to auscultation bilaterally  Heart:  regular rate and rhythm  Abdomen: soft, non-tender; bowel sounds normal; no masses,  no organomegaly   Vulva:  normal  Vagina: normal vagina  Cervix:  no cervical motion tenderness, IUD strings seen and are very long, cut to 2 cm length outside cervix  Corpus: normal size, contour, position, consistency, mobility, non-tender  Adnexa:  not evaluated  Rectal Exam: Not performed.        Assessment:   Normal postpartum exam. IUD check done.   Plan:   Essential components of care per ACOG recommendations:  1.  Mood and well being: Patient with negative depression screening today. Reviewed local  resources for support.  - Patient does not use tobacco or drugs  2. Infant care and feeding:  -Patient currently breastmilk feeding? No  -Social determinants of health (SDOH) reviewed in EPIC. No concerns.  3. Sexuality, contraception and birth spacing - Patient does not want a pregnancy in the next year.  Desired family size is 5 children.  Already has IUD in place. - Discussed birth spacing of 18 months  4. Sleep and fatigue -Encouraged family/partner/community support of 4 hrs of uninterrupted sleep to help with mood and fatigue  5. Physical Recovery  - Discussed patients delivery - Patient has urinary incontinence? No - Patient is safe to resume physical and sexual activity  6.  Health Maintenance - Last pap smear done 06/26/2019 and was normal with negative HPV.  7. History of A1GDM - Needs 2 hr GTT postpartum, will schedule - PCP follow up    Jaynie Collins, MD [facility], [facility]

## 2020-02-12 ENCOUNTER — Other Ambulatory Visit: Payer: Medicaid Other

## 2020-02-12 ENCOUNTER — Other Ambulatory Visit: Payer: Self-pay

## 2020-02-12 DIAGNOSIS — Z8632 Personal history of gestational diabetes: Secondary | ICD-10-CM

## 2020-02-13 LAB — GLUCOSE TOLERANCE, 2 HOURS
Glucose, 2 hour: 92 mg/dL (ref 65–139)
Glucose, GTT - Fasting: 90 mg/dL (ref 65–99)

## 2020-02-15 ENCOUNTER — Encounter: Payer: Self-pay | Admitting: Obstetrics & Gynecology

## 2020-04-27 ENCOUNTER — Ambulatory Visit (INDEPENDENT_AMBULATORY_CARE_PROVIDER_SITE_OTHER): Payer: Medicaid Other | Admitting: Obstetrics

## 2020-04-27 ENCOUNTER — Encounter: Payer: Self-pay | Admitting: Obstetrics

## 2020-04-27 ENCOUNTER — Other Ambulatory Visit: Payer: Self-pay

## 2020-04-27 VITALS — BP 103/70 | HR 60 | Wt 166.0 lb

## 2020-04-27 DIAGNOSIS — Z30431 Encounter for routine checking of intrauterine contraceptive device: Secondary | ICD-10-CM

## 2020-04-27 NOTE — Progress Notes (Signed)
Interperter ID# [id number]  Used for today's services.  Pt states that IUD strings are hanging out/low.

## 2020-04-27 NOTE — Progress Notes (Signed)
Subjective:    Stephanie Wolfe is a 34 y.o. female who presents for IUD check.  An interpreter was present for communication.   She states that she can feel the IUD string coming out of the cervix and feel that it may be too long and unsanitary with her period, sex, etc.   The patient is sexually active. Pertinent past medical history: none.  The information documented in the HPI was reviewed and verified.  Menstrual History: OB History    Gravida  5   Para  5   Term  5   Preterm      AB      Living  5     SAB      TAB      Ectopic      Multiple  0   Live Births  5           Menarche age:  No LMP recorded.   Patient Active Problem List   Diagnosis Date Noted  . Liletta IUD (intrauterine device) in place since 12/26/2019 01/28/2020  . Language barrier 08/25/2019   Past Medical History:  Diagnosis Date  . Gestational diabetes    Normal 2 hr GTT    Past Surgical History:  Procedure Laterality Date  . CESAREAN SECTION    . CESAREAN SECTION N/A 12/26/2019   Procedure: CESAREAN SECTION;  Surgeon: Hermina Staggers, MD;  Location: [facility] LD ORS;  Service: Obstetrics;  Laterality: N/A;     Current Outpatient Medications:  .  ferrous sulfate 325 (65 FE) MG tablet, Take 1 tablet (325 mg total) by mouth every other day. (Patient not taking: Reported on 01/04/2020), Disp: 30 tablet, Rfl: 1 .  Prenatal Vit-Fe Fumarate-FA (PREPLUS) 27-1 MG TABS, Take 1 tablet by mouth daily., Disp: 30 tablet, Rfl: 13 No Known Allergies  Social History   Tobacco Use  . Smoking status: Never Smoker  . Smokeless tobacco: Never Used  Substance Use Topics  . Alcohol use: Not Currently    Family History  Problem Relation Age of Onset  . Cancer Mother   . Hypertension Father   . Cancer Maternal Grandfather   . Cancer Paternal Grandmother   . Hypertension Paternal Grandmother   . Hypertension Paternal Uncle   . Hypertension Paternal Grandfather        Review of Systems Constitutional:  negative for weight loss Genitourinary:negative for abnormal menstrual periods and vaginal discharge   Objective:   BP 103/70   Pulse 60   Wt 166 lb (75.3 kg)   BMI 28.00 kg/m           General: Alert and no distress Abdomen:  normal findings: no organomegaly, soft, non-tender and no hernia  Pelvis:  External genitalia: normal general appearance Urinary system: urethral meatus normal and bladder without fullness, nontender Vaginal: normal without tenderness, induration or masses Cervix: normal appearance.  IUD string visible and appears to be normal length of 3-4 cm Adnexa: normal bimanual exam Uterus: anteverted and non-tender, normal size   Lab Review Urine pregnancy test Labs reviewed yes Radiologic studies reviewed no  50% of 15 min visit spent on counseling and coordination of care.    Assessment:    34 y.o., continuing IUD, no contraindications.    The patient was reassured through the interpreter that the IUD string is normal length and that it is normal to be able to feel it coming out of the cervix, and this by itself does not increase her risk for  infection.  Plan:    All questions answered. Contraception: IUD. Follow up in 2 months.  Annual / Pap   Brock Bad, MD 04/27/2020 10:47 AM

## 2020-06-28 ENCOUNTER — Ambulatory Visit: Payer: Medicaid Other | Admitting: Advanced Practice Midwife

## 2020-06-29 ENCOUNTER — Encounter (HOSPITAL_COMMUNITY): Payer: Self-pay | Admitting: Emergency Medicine

## 2020-06-29 ENCOUNTER — Emergency Department (HOSPITAL_COMMUNITY): Payer: Medicaid Other

## 2020-06-29 ENCOUNTER — Emergency Department (HOSPITAL_COMMUNITY)
Admission: EM | Admit: 2020-06-29 | Discharge: 2020-06-29 | Disposition: A | Discharge status: Home / Self Care | Payer: Medicaid Other | Attending: Emergency Medicine | Admitting: Emergency Medicine

## 2020-06-29 DIAGNOSIS — M25512 Pain in left shoulder: Secondary | ICD-10-CM | POA: Insufficient documentation

## 2020-06-29 MED ORDER — METHOCARBAMOL 500 MG PO TABS
500.0000 mg | ORAL_TABLET | Freq: Three times a day (TID) | ORAL | 0 refills | Status: DC | PRN
Start: 2020-06-29 — End: 2020-06-29

## 2020-06-29 MED ORDER — NAPROXEN 250 MG PO TABS: 500.0000 mg | ORAL_TABLET | Freq: Once | ORAL | Status: DC

## 2020-06-29 MED ORDER — METHOCARBAMOL 500 MG PO TABS
500.0000 mg | ORAL_TABLET | Freq: Three times a day (TID) | ORAL | 0 refills | Status: AC | PRN
Start: 2020-06-29 — End: ?

## 2020-06-29 MED ORDER — NAPROXEN 500 MG PO TABS
500.0000 mg | ORAL_TABLET | Freq: Two times a day (BID) | ORAL | 0 refills | Status: AC | PRN
Start: 2020-06-29 — End: ?

## 2020-06-29 MED ORDER — NAPROXEN 500 MG PO TABS
500.0000 mg | ORAL_TABLET | Freq: Two times a day (BID) | ORAL | 0 refills | Status: DC | PRN
Start: 2020-06-29 — End: 2020-06-29

## 2020-06-29 NOTE — ED Triage Notes (Signed)
Mandarin interpreter used: Pt c/o left shoulder pain, states she was holding a baby, turned and heard/felt a pop.

## 2020-06-29 NOTE — ED Provider Notes (Signed)
[facility] EMERGENCY DEPARTMENT Provider Note   CSN: [id number] Arrival date & time: 06/29/20  0118     History Chief Complaint  Patient presents with  . Shoulder Pain    Stephanie Wolfe is a 35 y.o. female who presents to the emergency department with complaints of left shoulder pain that began tonight while she was carrying her child.  She states that she was holding her child and turned with a pop sensation in her left shoulder with subsequent onset of pain.  Pain is constant, worse with attempted movement, no alleviating factors.  She denies any other areas of injury.  Denies numbness, tingling, weakness, or open wounds.   Interpretor utilized throughout Audiological scientist.   HPI     Past Medical History:  Diagnosis Date  . Gestational diabetes    Normal 2 hr GTT    Patient Active Problem List   Diagnosis Date Noted  . Liletta IUD (intrauterine device) in place since 12/26/2019 01/28/2020  . Language barrier 08/25/2019    Past Surgical History:  Procedure Laterality Date  . CESAREAN SECTION    . CESAREAN SECTION N/A 12/26/2019   Procedure: CESAREAN SECTION;  Surgeon: Hermina Staggers, MD;  Location: [facility] LD ORS;  Service: Obstetrics;  Laterality: N/A;     OB History    Gravida  5   Para  5   Term  5   Preterm      AB      Living  5     SAB      IAB      Ectopic      Multiple  0   Live Births  5           Family History  Problem Relation Age of Onset  . Cancer Mother   . Hypertension Father   . Cancer Maternal Grandfather   . Cancer Paternal Grandmother   . Hypertension Paternal Grandmother   . Hypertension Paternal Uncle   . Hypertension Paternal Grandfather     Social History   Tobacco Use  . Smoking status: Never Smoker  . Smokeless tobacco: Never Used  Vaping Use  . Vaping Use: Never used  Substance Use Topics  . Alcohol use: Not Currently  . Drug use: Never    Home Medications Prior to Admission medications    Medication Sig Start Date End Date Taking? Authorizing Provider  ferrous sulfate 325 (65 FE) MG tablet Take 1 tablet (325 mg total) by mouth every other day. Patient not taking: Reported on 01/04/2020 12/29/19   Venora Maples, MD  Prenatal Vit-Fe Fumarate-FA (PREPLUS) 27-1 MG TABS Take 1 tablet by mouth daily. 10/20/19   FairHoyle Sauer, MD    Allergies    Patient has no known allergies.  Review of Systems   Review of Systems  Constitutional: Negative for chills and fever.  Respiratory: Negative for shortness of breath.   Cardiovascular: Negative for chest pain.  Musculoskeletal: Positive for arthralgias.  Skin: Negative for color change, rash and wound.  Neurological: Negative for weakness and numbness.    Physical Exam Updated Vital Signs BP 112/73 (BP Location: Right Arm)   Pulse 69   Temp 97.7 F (36.5 C) (Oral)   Resp 16   LMP 06/15/2020   SpO2 100%   Physical Exam Vitals and nursing note reviewed.  Constitutional:      General: She is not in acute distress.    Appearance: Normal appearance. She is not ill-appearing or  toxic-appearing.  HENT:     Head: Normocephalic and atraumatic.  Neck:     Comments: No midline tenderness.  Cardiovascular:     Rate and Rhythm: Normal rate and regular rhythm.     Pulses:          Radial pulses are 2+ on the right side and 2+ on the left side.  Pulmonary:     Effort: Pulmonary effort is normal. No respiratory distress.     Breath sounds: Normal breath sounds.  Musculoskeletal:     Cervical back: Normal range of motion and neck supple.     Comments: Upper extremities: No obvious deformity, appreciable swelling, edema, erythema, ecchymosis, warmth, or open wounds. Patient has intact AROM throughout the major joints of the upper extremities, the left shoulder is the exception to this and is limited with flexion and abduction and fairly significantly, unable to actively get to 90 degrees with each of these movements.  I am able to  passively range the joint with some discomfort.  Patient tender to the left anterior glenohumeral joint as well as the [facility] joint.  She is otherwise nontender. Back: No midline tenderness  Skin:    General: Skin is warm and dry.     Capillary Refill: Capillary refill takes less than 2 seconds.  Neurological:     Mental Status: She is alert.     Comments: Alert. Clear speech. Sensation grossly intact to bilateral upper extremities. 5/5 symmetric grip strength. Ambulatory.  Able to form okay sign, thumbs up, cross second/third digits bilaterally.  Psychiatric:        Mood and Affect: Mood normal.        Behavior: Behavior normal.     ED Results / Procedures / Treatments   Labs (all labs ordered are listed, but only abnormal results are displayed) Labs Reviewed - No data to display  EKG None  Radiology DG Shoulder Left  Result Date: 06/29/2020 CLINICAL DATA:  Pain EXAM: LEFT SHOULDER - 2+ VIEW COMPARISON:  None. FINDINGS: There is no evidence of fracture or dislocation. There is no evidence of arthropathy or other focal bone abnormality. Soft tissues are unremarkable. IMPRESSION: Negative. Electronically Signed   By: Katherine Mantle M.D.   On: 06/29/2020 01:47    Procedures Procedures (including critical care time)  Medications Ordered in ED Medications - No data to display  ED Course  I have reviewed the triage vital signs and the nursing notes.  Pertinent labs & imaging results that were available during my care of the patient were reviewed by me and considered in my medical decision making (see chart for details).    MDM Rules/Calculators/A&P                          Patient presents to the emergency department with complaints of left shoulder pain after popping sensation that occurred when she was turning and holding her child.  X-rays obtained per triage protocol, I have reviewed and interpreted imaging, no fracture/dislocation. No open wounds. No signs of infection. ROM  limited, tender anteriorly. NVI distally. Placed in sling. Will trial naproxen/robaxin with orthopedics follow up. I discussed results, treatment plan, need for follow-up, and return precautions with the patient. Provided opportunity for questions, patient confirmed understanding and is in agreement with plan.   Final Clinical Impression(s) / ED Diagnoses Final diagnoses:  Acute pain of left shoulder    Rx / DC Orders ED Discharge Orders  Ordered    naproxen (NAPROSYN) 500 MG tablet  2 times daily PRN,   Status:  Discontinued        06/29/20 0240    methocarbamol (ROBAXIN) 500 MG tablet  Every 8 hours PRN,   Status:  Discontinued        06/29/20 0240    methocarbamol (ROBAXIN) 500 MG tablet  Every 8 hours PRN        06/29/20 0246    naproxen (NAPROSYN) 500 MG tablet  2 times daily PRN        06/29/20 0246           Cherly Anderson, PA-C 06/29/20 0251    Gilda Crease, MD 06/29/20 [phone]

## 2020-06-29 NOTE — Discharge Instructions (Addendum)
Please read and follow all provided instructions.  You have been seen today for a left shoulder injury.   Tests performed today include: An x-ray of the affected area - does NOT show any broken bones or dislocations.  Vital signs. See below for your results today.   Home care instructions: -- *PRICE in the first 24-48 hours after injury: Protect (with brace, splint, sling), if given by your provider-- please be sure to move your shoulder some while in the splint, do not keep it completely still as this can cause frozen shoulder syndrome.  Rest Ice- Do not apply ice pack directly to your skin, place towel or similar between your skin and ice/ice pack. Apply ice for 20 min, then remove for 40 min while awake Compression- Wear brace, elastic bandage, splint as directed by your provider Elevate affected extremity above the level of your heart when not walking around for the first 24-48 hours   Medications:  - Naproxen is a nonsteroidal anti-inflammatory medication that will help with pain and swelling. Be sure to take this medication as prescribed with food, 1 pill every 12 hours,  It should be taken with food, as it can cause stomach upset, and more seriously, stomach bleeding. Do not take other nonsteroidal anti-inflammatory medications with this such as Advil, Motrin, Aleve, Mobic, Goodie Powder, or Motrin.    - Robaxin is the muscle relaxer I have prescribed, this is meant to help with muscle tightness. Be aware that this medication may make you drowsy therefore the first time you take this it should be at a time you are in an environment where you can rest. Do not drive or operate heavy machinery when taking this medication. Do not drink alcohol or take other sedating medications with this medicine such as narcotics or benzodiazepines.   You make take Tylenol per over the counter dosing with these medications.   We have prescribed you new medication(s) today. Discuss the medications prescribed  today with your pharmacist as they can have adverse effects and interactions with your other medicines including over the counter and prescribed medications. Seek medical evaluation if you start to experience new or abnormal symptoms after taking one of these medicines, seek care immediately if you start to experience difficulty breathing, feeling of your throat closing, facial swelling, or rash as these could be indications of a more serious allergic reaction   Follow-up instructions: Please follow-up with your primary care provider or the provided orthopedic physician (bone specialist) if you continue to have significant pain in 1 week. In this case you may have a more severe injury that requires further care.   Return instructions:  Please return if your digits or extremity are numb or tingling, appear gray or blue, or you have severe pain (also elevate the extremity and loosen splint or wrap if you were given one) Please return if you have redness or fevers.  Please return to the Emergency Department if you experience worsening symptoms.  Please return if you have any other emergent concerns. Additional Information:  Your vital signs today were: BP 112/73 (BP Location: Right Arm)    Pulse 69    Temp 97.7 F (36.5 C) (Oral)    Resp 16    LMP 06/15/2020    SpO2 100%  If your blood pressure (BP) was elevated above 135/85 this visit, please have this repeated by your doctor within one month. ---------------

## 2020-06-30 ENCOUNTER — Encounter: Payer: Self-pay | Admitting: Orthopaedic Surgery

## 2020-06-30 ENCOUNTER — Other Ambulatory Visit: Payer: Self-pay

## 2020-06-30 ENCOUNTER — Ambulatory Visit (INDEPENDENT_AMBULATORY_CARE_PROVIDER_SITE_OTHER): Payer: Medicaid Other | Admitting: Orthopaedic Surgery

## 2020-06-30 DIAGNOSIS — M25512 Pain in left shoulder: Secondary | ICD-10-CM | POA: Insufficient documentation

## 2020-06-30 NOTE — Progress Notes (Signed)
Office Visit Note   Patient: Stephanie Wolfe           Date of Birth: [birth year]           MRN: [id number] Visit Date: 06/30/2020              Requested by: No referring provider defined for this encounter. PCP: Patient, No Pcp Per   Assessment & Plan: Visit Diagnoses:  1. Acute pain of left shoulder     Plan: ACute onset of left shoulder pain within the last several days.  Accompanied by an interpreter as she does not speak [place]  No specific injury or trauma but she turned rather quickly towards her left side with acute onset of pain.  No prior problems with her shoulder.  She did not fall.  Seen in the emergency room and placed on Robaxin and Naprosyn and asked to follow-up with orthopedics.  X-rays were performed which I reviewed in the PACS system.  No evidence of fracture subluxation or dislocation.  Problem appears to be soft tissue.  I would continue with the sling and the above medicines and even try Voltaren gel and return in 2 weeks if no better.  Hopefully this will resolve without any further intervention.  Not sure the exact pathology but obviously appears to be soft tissue.  No apprehension.  No sensation of shoulder subluxation or dislocation.  Consider subacromial cortisone injection if no better in 2 weeks  Follow-Up Instructions: Return in about 2 weeks (around 07/14/2020).   Orders:  No orders of the defined types were placed in this encounter.  No orders of the defined types were placed in this encounter.     Procedures: No procedures performed   Clinical Data: No additional findings.   Subjective: Chief Complaint  Patient presents with  . Left Shoulder - Pain    DOI 06/27/2020  Patient presents today for her left shoulder. She said that she was holding her daughter and turned around. She said that she felt a pop in her shoulder and immediate pain. She went to [facility] ED yesterday and had x-rays. She continues to hurt anteriorly when she  tries to raise her arm. She is right hand dominant.   HPI  Review of Systems   Objective: Vital Signs: Ht 5\' 4"  (1.626 m)   Wt 166 lb (75.3 kg)   LMP 06/15/2020   BMI 28.49 kg/m   Physical Exam Constitutional:      Appearance: She is well-developed and well-nourished.  HENT:     Mouth/Throat:     Mouth: Oropharynx is clear and moist.  Eyes:     Extraocular Movements: EOM normal.     Pupils: Pupils are equal, round, and reactive to light.  Pulmonary:     Effort: Pulmonary effort is normal.  Skin:    General: Skin is warm and dry.  Neurological:     Mental Status: She is alert and oriented to person, place, and time.  Psychiatric:        Mood and Affect: Mood and affect normal.        Behavior: Behavior normal.     Ortho Exam awake alert and oriented x3.  Comfortable sitting.  Accompanied by an interpreter.  Left shoulder skin intact no ecchymosis erythema.  Biceps intact.  Could actively abduct 90 degrees but had some difficulty with internal rotation with anterior lateral subacromial pain.  No pain to palpation along the biceps tendon or biceps muscle.  Some very minimal tenderness in the anterior lateral subacromial region.  Good grip and good release.  No distal edema.  I can flex her passively probably 110 degrees at which point she was uncomfortable.  Some pain with speeds sign testing but seem to be more related to the subacromial region Specialty Comments:  No specialty comments available.  Imaging: No results found.   PMFS History: Patient Active Problem List   Diagnosis Date Noted  . Pain in left shoulder 06/30/2020  . Liletta IUD (intrauterine device) in place since 12/26/2019 01/28/2020  . Language barrier 08/25/2019   Past Medical History:  Diagnosis Date  . Gestational diabetes    Normal 2 hr GTT    Family History  Problem Relation Age of Onset  . Cancer Mother   . Hypertension Father   . Cancer Maternal Grandfather   . Cancer Paternal  Grandmother   . Hypertension Paternal Grandmother   . Hypertension Paternal Uncle   . Hypertension Paternal Grandfather     Past Surgical History:  Procedure Laterality Date  . CESAREAN SECTION    . CESAREAN SECTION N/A 12/26/2019   Procedure: CESAREAN SECTION;  Surgeon: Hermina Staggers, MD;  Location: [facility] LD ORS;  Service: Obstetrics;  Laterality: N/A;   Social History   Occupational History  . Not on file  Tobacco Use  . Smoking status: Never Smoker  . Smokeless tobacco: Never Used  Vaping Use  . Vaping Use: Never used  Substance and Sexual Activity  . Alcohol use: Not Currently  . Drug use: Never  . Sexual activity: Yes

## 2020-07-20 ENCOUNTER — Ambulatory Visit: Payer: Medicaid Other | Admitting: Orthopaedic Surgery

## 2020-08-01 ENCOUNTER — Ambulatory Visit: Payer: Medicaid Other | Admitting: Advanced Practice Midwife

## 2021-02-12 IMAGING — CT CT ANGIO CHEST
2 of 6 series · 19 of 36 positions shown · IV contrast (omnipaque)
Comparison: None.

CLINICAL DATA: Chest pain and shortness of breath when laying down.

EXAM:
CT ANGIOGRAPHY CHEST WITH CONTRAST
TECHNIQUE: Multidetector CT imaging of the chest was performed using the
standard protocol during bolus administration of intravenous
contrast. Multiplanar CT image reconstructions and MIPs were
obtained to evaluate the vascular anatomy.
CONTRAST:  75mL OMNIPAQUE IOHEXOL 350 MG/ML SOLN

[Series 7: pe thins · axial · 0.72mm/px · z∈[+1043,+1286]mm · 18 of 388 slices shown]
[im 20/388  lung]
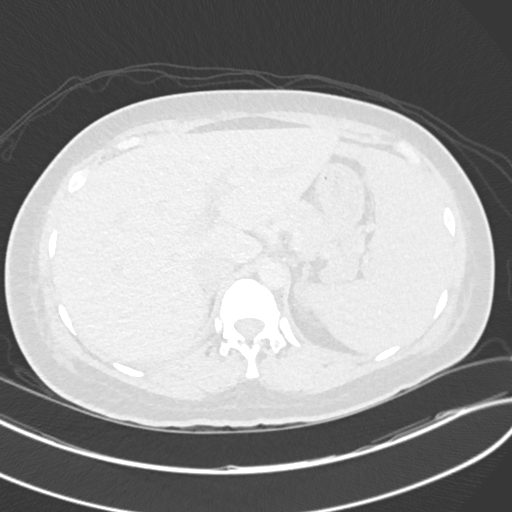
[im 39/388  mediastinal]
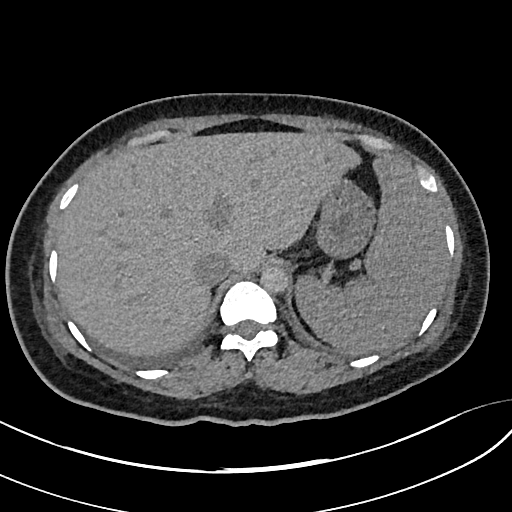
[im 59/388  lung]
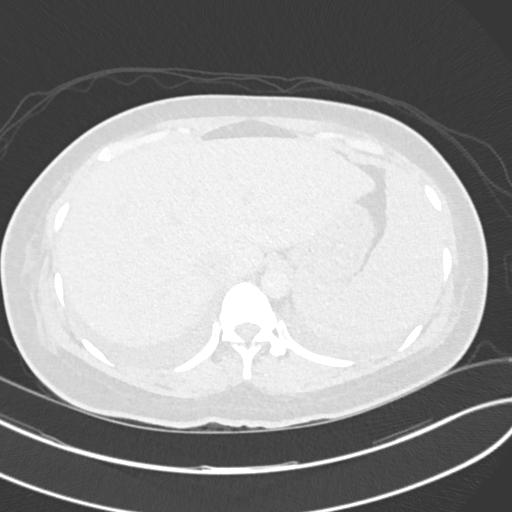
[im 78/388  mediastinal]
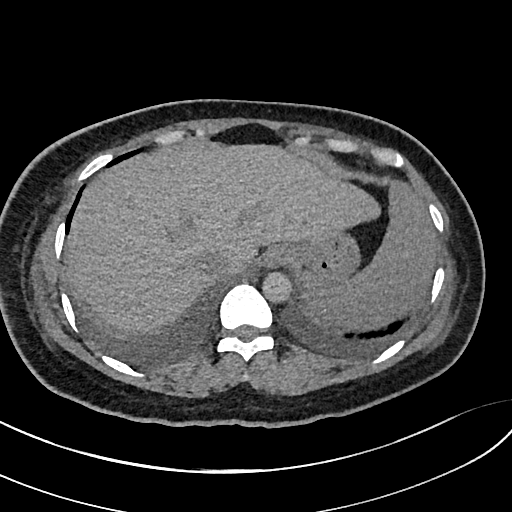
[im 97/388  lung]
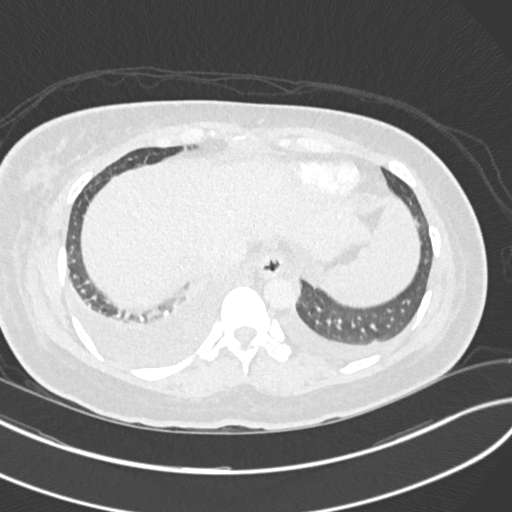
[im 117/388  mediastinal]
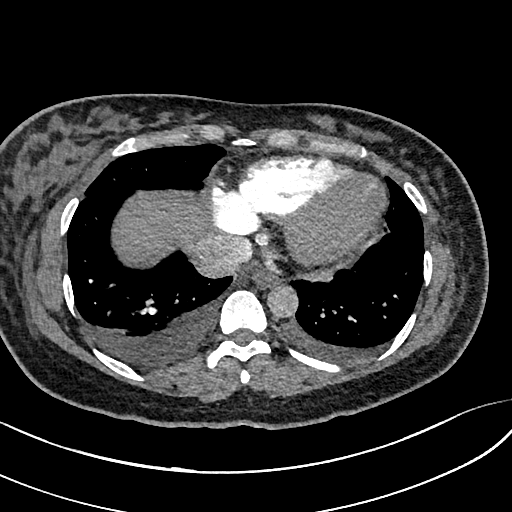
[im 136/388  lung]
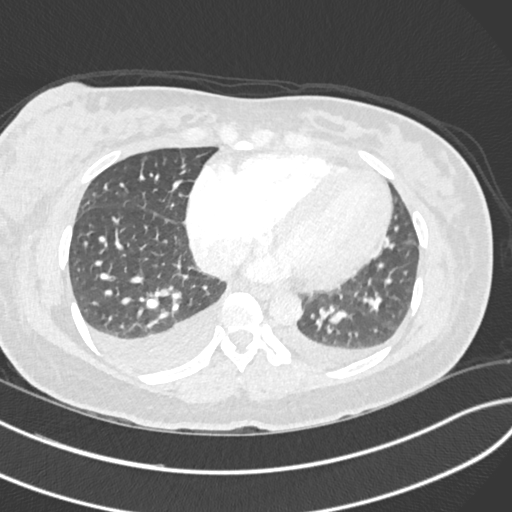
[im 155/388  mediastinal]
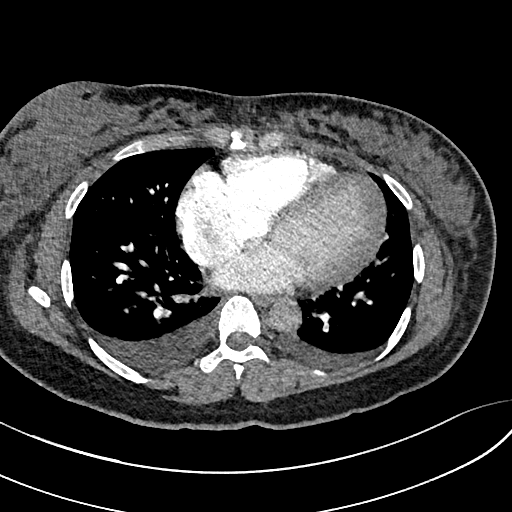
[im 175/388  lung]
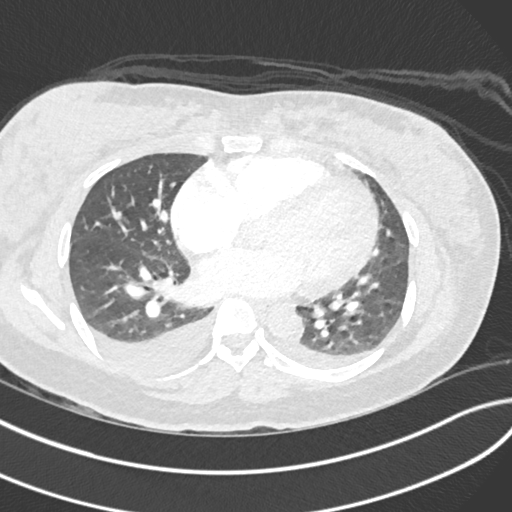
[im 213/388  mediastinal]
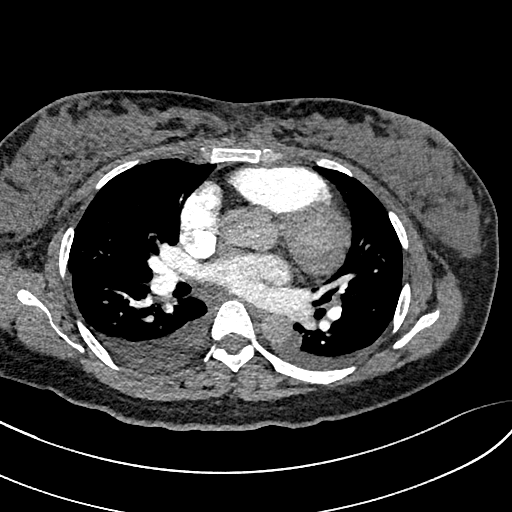
[im 233/388  lung]
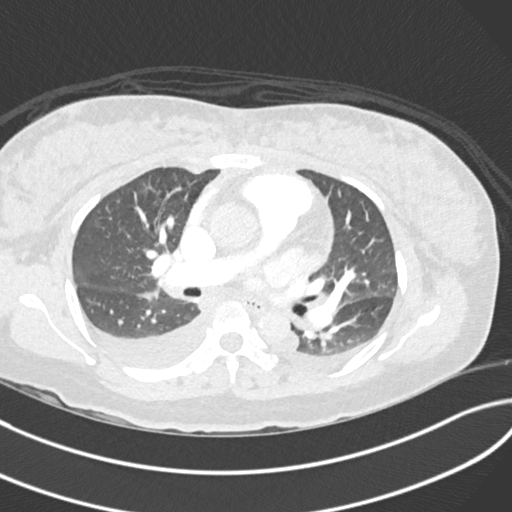
[im 252/388  mediastinal]
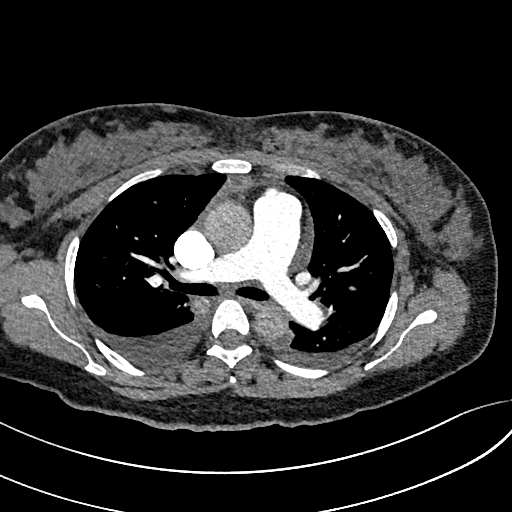
[im 271/388  lung]
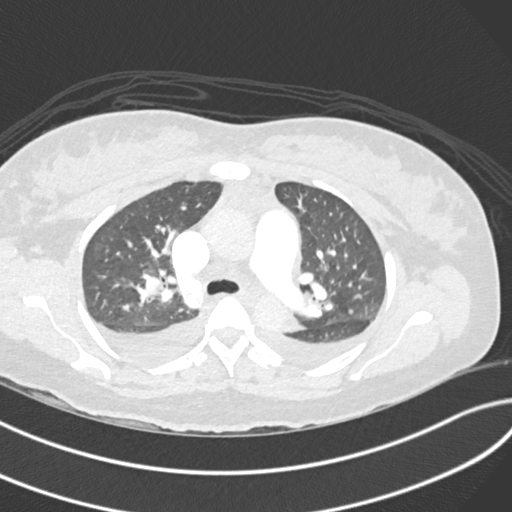
[im 291/388  mediastinal]
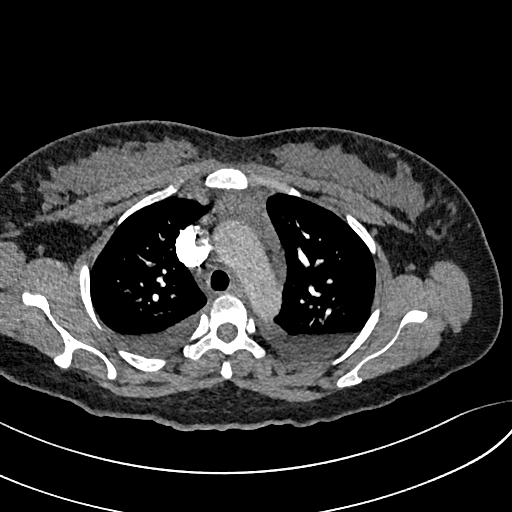
[im 310/388  lung]
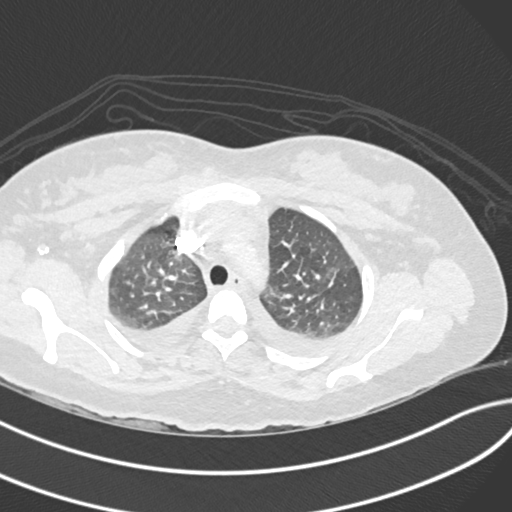
[im 329/388  mediastinal]
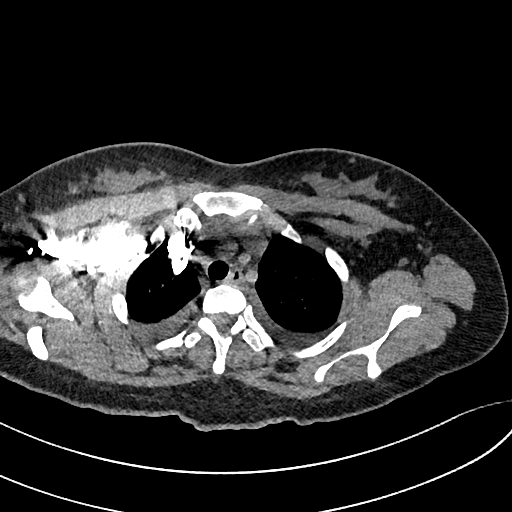
[im 349/388  lung]
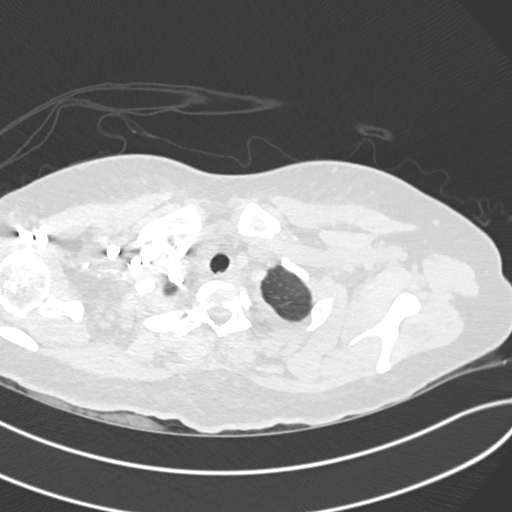
[im 368/388  mediastinal]
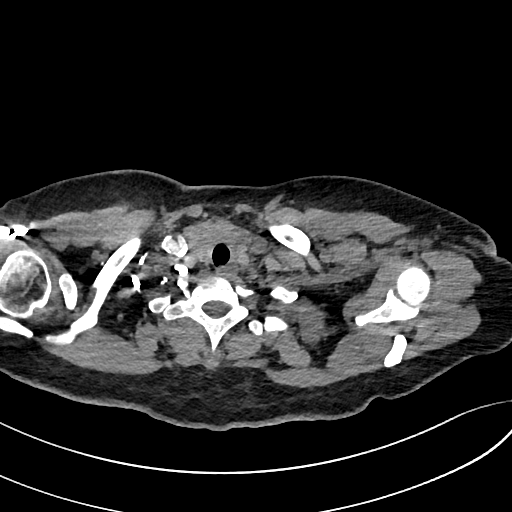

[Series 8: pe 2mm cor · coronal · 0.54mm/px · 1 of 148 slices shown]
[im 74/148  mediastinal]
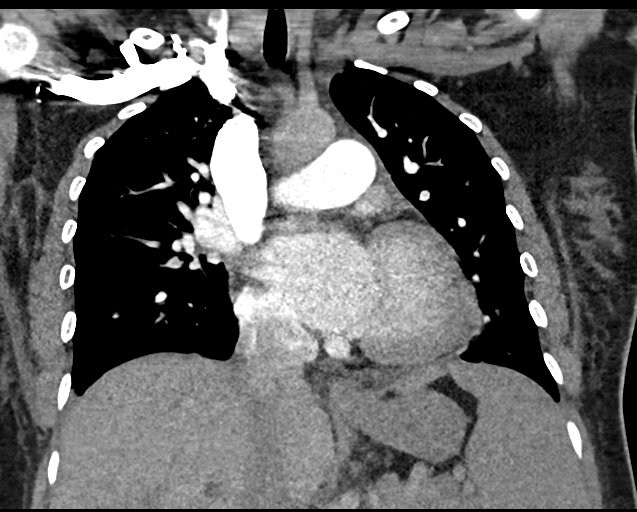

[19 of 36 positions shown; findings below may reference images not displayed]

FINDINGS: Cardiovascular: Satisfactory opacification of the pulmonary arteries
to the segmental level. No evidence of pulmonary embolism when
allowing for levels of streak artifact. Normal heart size. No
pericardial effusion.

Mediastinum/Nodes: Negative for adenopathy or mass

Lungs/Pleura: Small layering pleural effusions. Generalized airway
cuffing and minimal interlobular septal thickening at the bases and
apices. No consolidation.

Upper Abdomen: Generous splenic size, stable from abdominal CT
07/16/2018

Musculoskeletal: Negative

Review of the MIP images confirms the above findings.
IMPRESSION: Small pleural effusions and minimal interstitial edema.

No evidence of pulmonary embolism.
